# Patient Record
Sex: Male | Born: 1945 | Race: White | Hispanic: No | Marital: Married | Smoking: Former smoker
Health system: Southern US, Community
[De-identification: ages and names within clinical notes are randomized; demographics above are authoritative.]

## PROBLEM LIST (undated history)

## (undated) DIAGNOSIS — Z9189 Other specified personal risk factors, not elsewhere classified: Secondary | ICD-10-CM

## (undated) DIAGNOSIS — Z789 Other specified health status: Secondary | ICD-10-CM

## (undated) DIAGNOSIS — I10 Essential (primary) hypertension: Secondary | ICD-10-CM

## (undated) DIAGNOSIS — K219 Gastro-esophageal reflux disease without esophagitis: Secondary | ICD-10-CM

## (undated) DIAGNOSIS — R3915 Urgency of urination: Secondary | ICD-10-CM

## (undated) DIAGNOSIS — Z8739 Personal history of other diseases of the musculoskeletal system and connective tissue: Secondary | ICD-10-CM

## (undated) DIAGNOSIS — G4733 Obstructive sleep apnea (adult) (pediatric): Secondary | ICD-10-CM

## (undated) DIAGNOSIS — Z8551 Personal history of malignant neoplasm of bladder: Secondary | ICD-10-CM

## (undated) DIAGNOSIS — R351 Nocturia: Secondary | ICD-10-CM

## (undated) DIAGNOSIS — E785 Hyperlipidemia, unspecified: Secondary | ICD-10-CM

## (undated) DIAGNOSIS — K649 Unspecified hemorrhoids: Secondary | ICD-10-CM

## (undated) DIAGNOSIS — R413 Other amnesia: Secondary | ICD-10-CM

## (undated) HISTORY — PX: CATARACT EXTRACTION W/ INTRAOCULAR LENS  IMPLANT, BILATERAL: SHX1307

## (undated) HISTORY — DX: Other amnesia: R41.3

---

## 1965-04-15 HISTORY — PX: OTHER SURGICAL HISTORY: SHX169

## 2001-06-09 ENCOUNTER — Ambulatory Visit (HOSPITAL_COMMUNITY): Admission: RE | Admit: 2001-06-09 | Discharge: 2001-06-09 | Payer: Self-pay | Admitting: Gastroenterology

## 2007-09-15 ENCOUNTER — Ambulatory Visit (HOSPITAL_BASED_OUTPATIENT_CLINIC_OR_DEPARTMENT_OTHER): Admission: RE | Admit: 2007-09-15 | Discharge: 2007-09-16 | Payer: Self-pay | Admitting: Orthopedic Surgery

## 2007-09-15 HISTORY — PX: SHOULDER ARTHROSCOPY DISTAL CLAVICLE EXCISION AND OPEN ROTATOR CUFF REPAIR: SHX2396

## 2010-07-06 ENCOUNTER — Other Ambulatory Visit: Payer: Self-pay | Admitting: Urology

## 2010-07-06 ENCOUNTER — Inpatient Hospital Stay (HOSPITAL_COMMUNITY)
Admission: AD | Admit: 2010-07-06 | Discharge: 2010-07-07 | DRG: 311 | Disposition: A | Discharge status: Home / Self Care | Payer: BC Managed Care – PPO | Source: Ambulatory Visit | Attending: Urology | Admitting: Urology

## 2010-07-06 ENCOUNTER — Ambulatory Visit (HOSPITAL_BASED_OUTPATIENT_CLINIC_OR_DEPARTMENT_OTHER)
Admission: RE | Admit: 2010-07-06 | Discharge: 2010-07-06 | Disposition: A | Discharge status: Still In Hospital | Payer: BC Managed Care – PPO | Source: Ambulatory Visit | Attending: Urology | Admitting: Urology

## 2010-07-06 DIAGNOSIS — I10 Essential (primary) hypertension: Secondary | ICD-10-CM | POA: Insufficient documentation

## 2010-07-06 DIAGNOSIS — Z7982 Long term (current) use of aspirin: Secondary | ICD-10-CM

## 2010-07-06 DIAGNOSIS — Z01810 Encounter for preprocedural cardiovascular examination: Secondary | ICD-10-CM | POA: Insufficient documentation

## 2010-07-06 DIAGNOSIS — E669 Obesity, unspecified: Secondary | ICD-10-CM | POA: Insufficient documentation

## 2010-07-06 DIAGNOSIS — C670 Malignant neoplasm of trigone of bladder: Secondary | ICD-10-CM | POA: Insufficient documentation

## 2010-07-06 DIAGNOSIS — Z01812 Encounter for preprocedural laboratory examination: Secondary | ICD-10-CM | POA: Insufficient documentation

## 2010-07-06 DIAGNOSIS — R310 Gross hematuria: Secondary | ICD-10-CM | POA: Diagnosis present

## 2010-07-06 DIAGNOSIS — Z791 Long term (current) use of non-steroidal anti-inflammatories (NSAID): Secondary | ICD-10-CM

## 2010-07-06 DIAGNOSIS — Z79899 Other long term (current) drug therapy: Secondary | ICD-10-CM

## 2010-07-06 DIAGNOSIS — G4733 Obstructive sleep apnea (adult) (pediatric): Secondary | ICD-10-CM | POA: Insufficient documentation

## 2010-07-06 DIAGNOSIS — K219 Gastro-esophageal reflux disease without esophagitis: Secondary | ICD-10-CM | POA: Diagnosis present

## 2010-07-06 DIAGNOSIS — M109 Gout, unspecified: Secondary | ICD-10-CM | POA: Diagnosis present

## 2010-07-06 DIAGNOSIS — E7800 Pure hypercholesterolemia, unspecified: Secondary | ICD-10-CM | POA: Diagnosis present

## 2010-07-06 HISTORY — PX: TRANSURETHRAL RESECTION OF BLADDER TUMOR: SHX2575

## 2010-07-06 LAB — POCT I-STAT 4, (NA,K, GLUC, HGB,HCT)
Glucose, Bld: 96 mg/dL (ref 70–99)
HCT: 39 % (ref 39.0–52.0)

## 2010-07-10 ENCOUNTER — Observation Stay (HOSPITAL_COMMUNITY)
Admission: EM | Admit: 2010-07-10 | Discharge: 2010-07-12 | Disposition: A | Discharge status: Home / Self Care | Payer: BC Managed Care – PPO | Attending: Urology | Admitting: Urology

## 2010-07-10 DIAGNOSIS — I10 Essential (primary) hypertension: Secondary | ICD-10-CM | POA: Insufficient documentation

## 2010-07-10 DIAGNOSIS — Z79899 Other long term (current) drug therapy: Secondary | ICD-10-CM | POA: Insufficient documentation

## 2010-07-10 DIAGNOSIS — Z01812 Encounter for preprocedural laboratory examination: Secondary | ICD-10-CM | POA: Insufficient documentation

## 2010-07-10 DIAGNOSIS — Z01810 Encounter for preprocedural cardiovascular examination: Secondary | ICD-10-CM | POA: Insufficient documentation

## 2010-07-10 DIAGNOSIS — N3289 Other specified disorders of bladder: Principal | ICD-10-CM | POA: Insufficient documentation

## 2010-07-10 LAB — URINALYSIS, ROUTINE W REFLEX MICROSCOPIC: Specific Gravity, Urine: 1.026 (ref 1.005–1.030)

## 2010-07-10 LAB — BASIC METABOLIC PANEL
CO2: 25 mEq/L (ref 19–32)
Calcium: 9.5 mg/dL (ref 8.4–10.5)
Chloride: 99 mEq/L (ref 96–112)
GFR calc Af Amer: >60 mL/min (ref >60)
Sodium: 133 mEq/L — ABNORMAL LOW (ref 135–145)

## 2010-07-10 LAB — DIFFERENTIAL
Basophils Absolute: 0 10*3/uL (ref 0.0–0.1)
Basophils Relative: 0 % (ref 0–1)
Neutro Abs: 4.4 10*3/uL (ref 1.7–7.7)
Neutrophils Relative %: 77 % (ref 43–77)

## 2010-07-10 LAB — CBC
Hemoglobin: 10.8 g/dL — ABNORMAL LOW (ref 13.0–17.0)
MCHC: 36 g/dL (ref 30.0–36.0)
Platelets: 167 10*3/uL (ref 150–400)
RBC: 3.43 MIL/uL — ABNORMAL LOW (ref 4.22–5.81)

## 2010-07-10 LAB — URINE MICROSCOPIC-ADD ON

## 2010-07-11 ENCOUNTER — Observation Stay (HOSPITAL_COMMUNITY): Payer: BC Managed Care – PPO

## 2010-07-11 HISTORY — PX: OTHER SURGICAL HISTORY: SHX169

## 2010-07-11 LAB — URINE CULTURE: Culture  Setup Time: 201203270851

## 2010-07-31 NOTE — Op Note (Signed)
  NAME:  Donald [name], Donald [name] NO.:  [id number]  MEDICAL RECORD NO.:  [id number]           PATIENT TYPE:  I  LOCATION:  1405                         FACILITY:  [facility]  PHYSICIAN:  Bertram Millard. Alandra Sando, M.D.DATE OF BIRTH:  June 04, 1945  DATE OF PROCEDURE:  07/06/2010 DATE OF DISCHARGE:  07/07/2010                              OPERATIVE REPORT   PRIMARY DIAGNOSIS:  A 2-cm bladder tumor.  POSTOPERATIVE DIAGNOSIS:  A 2-cm bladder tumor.  PRINCIPAL PROCEDURE:  Transurethral resection of the bladder tumour.  SURGEON:  Bertram Millard. Leobardo Granlund, MD  ANESTHESIA:  General with LMA.  COMPLICATIONS:  None.  SPECIMEN:  Bladder tumor, sent in to pathology.  BRIEF HISTORY:  A 65 year old male who presents for definitive management of his recently diagnosed left trigonal bladder tumor.  He was initially presented with gross hematuria.  Staging evaluation was negative.  Risks and complications of TURBT have been discussed with the patient.  He understands these and desires to proceed.  DESCRIPTION OF PROCEDURE:  The patient was identified in the holding area.  He received preoperative IV antibiotics, was taken to the operating room where general anesthetic was administered using LMA.  He was placed in the dorsal lithotomy position.  Genitalia and perineum prepped and draped.  Time-out was then called.  The procedure was then commenced.  A 28-French resectoscope sheath was placed transurethrally into his bladder.  Cursory exam of the bladder revealed a 2-3 cm papillary tumor at the left trigonal area.  I identified the right ureteral orifice, the left could not be seen as his bladder tumor obscured it.  I then placed a cutting loop, and resected the bladder tumor down to muscular layer.  The base was fairly wide and there was not just a small stalk.  It was obvious that we have gotten in the muscular layers with the resection, but I did not see any evidence of perforation.  I never  saw the ureteral orifice during this resection. The specimen was then irrigated from bladder and sent as bladder tumor. I then rechecked the base of the bladder tumor resection, no bleeding was seen.  At this point, a 20 [place] Foley catheter was placed and hooked and balloon was filled with 10 mL of water.  A plug was placed within the catheter.  The patient was awakened and taken to PACU in stable condition.  He tolerated procedure well.  In the PACU, I instilled 40 mg of mitomycin and 40 mL of diluent.  The plug was placed back on the catheter after instillation of this mitomycin.  It was left indwelling for 1 hour.     Bertram Millard. Retta Diones, M.D.     SMD/MEDQ  D:  07/11/2010  T:  07/11/2010  Job:  [id number]  Electronically Signed by Marcine Matar M.D. on 07/31/2010 01:17:13 PM

## 2010-07-31 NOTE — Op Note (Signed)
  NAME:  THURMAN, SARVER NO.:  [id number]  MEDICAL RECORD NO.:  [id number]           PATIENT TYPE:  O  LOCATION:  1417                         FACILITY:  [facility]  PHYSICIAN:  Bertram Millard. Denya Buckingham, M.D.DATE OF BIRTH:  [birth year]  DATE OF PROCEDURE:  07/11/2010 DATE OF DISCHARGE:                              OPERATIVE REPORT   PRINCIPAL PROCEDURES:  Cystoscopy, clot evacuation, fulguration of bleeders.  SURGEON:  Bertram Millard. Malkie Wille, M.D.  ANESTHESIA:  General with LMA.  COMPLICATIONS:  None.  BRIEF HISTORY:  This is a 65 year old male who is about 5 days out from a TURBT.  He had bleeding postoperatively in the PACU.  Catheter was placed and he was irrigated overnight.  He went home the following day. The patient presented yesterday to the emergency room with clot retention and a Foley catheter was placed.  A fair amount of clot was irrigated from his bladder.  The catheter is indwelling and at this point, he still has some bleeding.  He has significant bladder spasms as well.  As he has persistent bleeding, it was recommended that he undergo cystoscopy, clot evacuation and fulguration of bleeders.  Risks and complications have been discussed with the patient who desires to proceed.  DESCRIPTION OF PROCEDURE:  The patient was identified in holding area and had received his p.o. Cipro.  He was taken to the operating room where general anesthetic was administered.  He was placed in dorsal lithotomy position.  Genitalia and perineum were prepped and draped. Time-out was then performed.  A 28-French resectoscope sheath was then placed within his bladder. Approximately 50 cc of clot was evacuated.  The bladder then irrigated clear.  I then placed the resectoscope element with the cutting loop. The resected area could be seen.  It was a bit edematous.  There was one small vessel like area that was protruding through the middle of this area.  I did not think  that this was the ureter, which was not identified due to presence of the bladder tumor.  I then cauterized this area, as there was a fair amount of blood coming from it intermittently. Following cautery with the cutting loop, I observed the resected area for approximately 10 minutes.  No significant bleeding was seen at this point.  The scope was then removed and I placed a 20-French Foley catheter with 10 cc of water placed in the balloon.  It was hooked to dependent drainage.  The urine at this point was clear.  He was taken to PACU in stable condition after he was awakened.     Bertram Millard. Retta Diones, M.D.     SMD/MEDQ  D:  07/11/2010  T:  07/11/2010  Job:  [id number]  Electronically Signed by Marcine Matar M.D. on 07/31/2010 01:17:16 PM

## 2010-08-28 NOTE — Op Note (Signed)
NAME:  Donald [name], Donald [name]               ACCOUNT NO.:  [id number]   MEDICAL RECORD NO.:  [id number]          PATIENT TYPE:  AMB   LOCATION:  DSC                          FACILITY:  [facility]   PHYSICIAN:  Katy Fitch. Sypher, M.D. 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   POSTOPERATIVE DIAGNOSES:  1. Adhesive capsulitis, left shoulder.  2. Chronic degenerative labral tear, left shoulder.  3. A 99-95% deep surface partial articular surface tendon avulsion,      rotator cuff tear involving supraspinatus, infraspinatus, and      superior teres minor, and chronic subacromial bursitis.   OPERATION:  1. Examination of left shoulder under anesthesia documenting adhesive      capsulitis of glenohumeral joint with subsequent lysis of adhesions      and improvement of range of motion.  2. Diagnostic arthroscopy of left glenohumeral joint with      identification of degenerative labral pathology and adhesive      capsulitis, followed by extensive labral debridement, debridement      of adhesive capsulitis granulation tissue with restoration of      rotator interval, tenolysis of anterior capsule ligamentous      structures including the superior and middle glenohumeral ligaments      and arthroscopic debridement of deep surface complex retracted 99-      95% positive articular surface tear involving the supraspinatus,      infraspinatus, and teres minor  tendons.  3. Arthroscopic subacromial decompression with bursectomy,      coracoacromial ligament relaxation, and acromioplasty.  4. Arthroscopic resection of distal clavicle.  5. Open reconstruction of 2.5 tendon rotator cuff retracted tear,      measuring more than 6 cm from anterior to posterior, utilizing 3      medial footprint Bio-Corkscrew anchors, 2 swivel locks for over-the-      top technique, and a McLaughlin through one suture to secure the      supraspinatus anatomically.   OPERATING SURGEON:  Katy Fitch. Sypher, MD   ASSISTANT:  Marveen Reeks. Dasnoit, PA-C.   ANESTHESIA:  General by endotracheal technique without a supplemental  interscalene block.   SUPERVISING ANESTHESIOLOGIST:  Quita Skye. Krista Blue, MD   INDICATIONS:  Donald [name] is a 65 year old gentleman, referred through  the courtesy of Dr. Belva Bertin for evaluation and management of chronic  left shoulder pain predicament.   Donald [name] reported a several year history of increasing shoulder pain,  weakness of abduction and external rotation, and night discomfort.   Initial clinical examination in October 2008 suggested rotator cuff tear  and plain films documented AC arthropathy.  An MRI of left shoulder obtained in October 2008 documented severe  tendinopathy of the supraspinatus and infraspinatus tendons with a near  full-thickness articular surface tear with retraction.  Donald [name] was also  noted have a very unfavorable AC anatomy with a large medial acromial  osteophyte and a large anterior distal clavicle osteophyte causing  impingement with extensive labral degenerative changes and proximal  biceps tendinopathy.   Donald [name] was offered subacromial decompression, arthroscopic debridement, and  repair in the fall of 2008 and Donald [name] elected to postpone surgery until this  time.   Donald [name] returned in April 2009, reporting increasing pain and increasing  weakness.  She requested to proceed with the surgery at this time.   After  detailed informed consent, Donald [name] is brought to the operating room  anticipating debridement, decompression, and repair of the rotator cuff.   PROCEDURE:  Donald [name] was brought to the operating room and  placed in supine position upon the operating table.   Dr. Krista Blue provided a preoperative anesthesia consult and offered an  interscalene block.   Donald [name] stated Donald [name] was not concerned about postoperative pain and  elected to forego the risks of an interscalene block.   Donald [name] was subsequently brought to room #6 and placed in supine position  upon the operating table and under Dr. Robina Ade direct supervision,  general endotracheal anesthesia was induced.   Donald [name] was carefully positioned in the beach-chair position with aid of a  torso and head holder designed for arthroscopy.  Preoperative  examination of range of motion revealed stability of the shoulder to  anterior, inferior, and posterior stress followed by documentation of  combined elevation of a 155 degrees, external rotation of 70, internal  rotation of approximately 60, and extension lacking 15 degrees off the  scapular plane.  With gentle manipulation, adhesions were lysed and  combined elevation increased to 175 degrees, external rotation 290  degrees at 90 degrees abduction, internal rotation to 80 degrees, and  extension to neutral.   The left arm was then prepped with DuraPrep and draped with impervious  arthroscopy drapes.  The shoulder was distended with 20 mL of sterile  saline.  The scope was placed in standard posterior viewing portal.  Diagnostic arthroscopy immediately confirmed a 95-99% PASTA lesion  involving the entire supraspinatus, supraspinatus, and portion of the  teres minor.  An anterior portal was created followed by complete  debridement of the deep surface of the tear.  The supraspinatus and  infraspinatus were retracted medially under deep surface about 3-4 cm.  This was a complex tear.  We  initially created the lateral arthroscopic  portal in an effort to debride the tuberosity.  However, given the  overlying bursa was still intact, we elected ultimately to forego  arthroscopic repair due to the wide footprint of the retracted deep  surface tear, ultimately deciding an open repair was appropriate.  The  anterior capsule was debrided of all granulation tissue.  The anterior  middle and anterior and superior glenohumeral ligaments were debrided of  all granulation tissue and hemolyzed.  The rotator interval was  reestablished.  The degenerative labrum was debrided from 3 o'clock  anteriorly to 10 o'clock posteriorly.  Hemostasis was achieved with  bipolar cautery.   After documentation of the tear predicament, we proceeded to subacromial  decompression and distal clavicle resection.  After bursectomy, the  anatomy of the coracoacromial arch was studied.  There was a large  anterior medial osteophyte and prominent AC  joint.  The capsule AC joint  was taken down the cutting cautery, revealing a very arthritic distal  clavicle.  The acromion was leveled to a type 1 morphology.  The  coracoacromial ligament was released with a cutting cautery and  hemostasis was achieved in the acromial artery branch.  The acromion was  carefully tailored medially to a flat surface and the distal 15-mm  clavicle was removed arthroscopically with particular attention to the  anterior osteophyte.  After hemostasis was achieved, we proceeded  directly to open repair of the rotator cuff.   A 7-cm longitudinal incision was fashioned across the middle of the  acromion laterally.  The middle third of deltoid was split followed by  placement of self-retaining retractors.  After bursectomy, the PASTA  lesion was taken down and the entire greater tuberosity was decorticated  with a bur and lowered using a high-speed Hall drill, lowering the  profile and removing the reactive osteophyte that had grown.   The entire  greater tuberosity deep to the footprint of the infraspinatus,  supraspinatus, and superior teres minor was decorticated and 3 medial  Bio-Corkscrew, 5.5 mm anchors were placed; 1 for the supraspinatus, 1  for the infraspinatus, and 1 at the junction of the infraspinatus and  teres minor.  A McLaughlin suture was placed to grasp the posterior  supraspinatus and was placed through drill holes through bone to  lateralize tendon, particularly on its deep surface.   A total of 6 mattress sutures were used to inset the medial margin of  the tear, followed by tensioning of the McLaughlin suture and using a  pair of over-the-top V sutures to inset the margin to 2 lateral swivel  locks.   A very satisfactory anatomic reconstruction was achieved.   The subacromial space was subsequently irrigated with a power irrigation  with the arthroscopic pump followed by repair the deltoid split with a  corner suture of #2 FiberWire and multiple interrupted sutures of 0  Vicryl repairing the deltoid split.  There were no apparent  complications.   Donald [name] skin was repaired with subdermal sutures of 2-0 Vicryl and  intradermal 3-0 Prolene.  A Steri-Strip was applied followed by  inflation of the wound with 0.25% Marcaine for postoperative analgesia.   Donald [name] will be discharged to the Postanesthesia Care Unit for observation of  vital signs and will be admitted to [facility] for appropriate  pain medication postoperatively.  We anticipate use of p.o. and IV  Dilaudid as well as possible IV PCA morphine.      Katy Fitch Sypher, M.D.  Electronically Signed     RVS/MEDQ  D:  09/15/2007  T:  09/16/2007  Job:  [id number]   cc:   Nadine Counts Dr. Modena Jansky

## 2010-08-31 ENCOUNTER — Ambulatory Visit (HOSPITAL_BASED_OUTPATIENT_CLINIC_OR_DEPARTMENT_OTHER)
Admission: RE | Admit: 2010-08-31 | Discharge: 2010-08-31 | Disposition: A | Discharge status: Home / Self Care | Payer: BC Managed Care – PPO | Source: Ambulatory Visit | Attending: Urology | Admitting: Urology

## 2010-08-31 ENCOUNTER — Other Ambulatory Visit: Payer: Self-pay | Admitting: Urology

## 2010-08-31 DIAGNOSIS — C679 Malignant neoplasm of bladder, unspecified: Secondary | ICD-10-CM | POA: Insufficient documentation

## 2010-08-31 DIAGNOSIS — I10 Essential (primary) hypertension: Secondary | ICD-10-CM | POA: Insufficient documentation

## 2010-08-31 DIAGNOSIS — E7800 Pure hypercholesterolemia, unspecified: Secondary | ICD-10-CM | POA: Insufficient documentation

## 2010-08-31 DIAGNOSIS — Z79899 Other long term (current) drug therapy: Secondary | ICD-10-CM | POA: Insufficient documentation

## 2010-08-31 HISTORY — PX: OTHER SURGICAL HISTORY: SHX169

## 2010-08-31 LAB — POCT I-STAT 4, (NA,K, GLUC, HGB,HCT)
HCT: 38 % — ABNORMAL LOW (ref 39.0–52.0)
Hemoglobin: 12.9 g/dL — ABNORMAL LOW (ref 13.0–17.0)

## 2010-08-31 NOTE — Procedures (Signed)
[name]  Patient:    Donald [name], Donald [name] Visit Number: [id number] MRN: [id number]          Service Type: END Location: ENDO Attending Physician:  Orland Mustard Dictated by:   Llana Aliment. Randa Evens, M.D. Proc. Date: 06/09/01 Admit Date:  06/09/2001   CC:         Donald [name], M.D.   Procedure Report  DATE OF BIRTH:  06-13-1945  PROCEDURE:  Colonoscopy.  MEDICATIONS:  Fentanyl 70 mcg, Versed 4 mg IV.  INDICATIONS:  A patient with heme positive stools on physical examination.  DESCRIPTION OF PROCEDURE:  With the procedure being explained to patient, consent obtained.  The patient was in the left lateral decubitus position. Digital examination was performed.  The Olympus scope was inserted and advanced under direct visualization.  The prep was excellent.  We were able to reach the cecum without difficulty.  The ileocecal valve and appendiceal orifice were seen.  The scope was withdrawn.  The cecum, ascending colon, hepatic flexure, transverse colon, splenic flexure, descending and sigmoid colons were seen well.  No polyps or other lesions were seen.  There was no significant diverticular disease.  Some small internal hemorrhoids were seen upon removal of the scope in the anal canal.  The scope was withdrawn.  The patient tolerated the procedure well and was maintained on low-flow oxygen and pulse oximeter through out the procedure.  ASSESSMENT:  Heme positive stool with only significant finding on colonoscopy being internal hemorrhoids.  PLAN:  Will see back in the office in 3 months to recheck stool. Dictated by:   Llana Aliment. Randa Evens, M.D. Attending Physician:  Orland Mustard DD:  06/09/01 TD:  06/09/01 Job: [id number]

## 2010-09-12 NOTE — Discharge Summary (Signed)
  NAME:  Donald [name], Donald [name] NO.:  [id number]  MEDICAL RECORD NO.:  [id number]           PATIENT TYPE:  I  LOCATION:  1405                         FACILITY:  [facility]  PHYSICIAN:  Bertram Millard. Vinetta Brach, M.D.DATE OF BIRTH:  04/03/1946  DATE OF ADMISSION:  07/06/2010 DATE OF DISCHARGE:  07/07/2010                              DISCHARGE SUMMARY   PRINCIPAL PROCEDURE:  TURBT  PRIMARY DIAGNOSES: 1. Urothelial carcinoma of the bladder. 2. Gross hematuria. 3. Hypertension. 4. History of gastroesophageal reflux disease. 5. History of hypercholesterolemia.  PROCEDURE:  On July 06, 2010, TURBT  BRIEF HISTORY:  [name] is a 65 year old male with recently diagnosed left-sided bladder tumor.  He is admitted for TURBT as his primary therapy.  HOSPITAL COURSE:  The patient was directly admitted to the operating room.  He underwent TURBT of the bladder tumor.  He tolerated the procedure well.  Postoperatively, he had significant hematuria.  He was admitted and spent the night in the hospital with his bladder catheterized and irrigated.  He was discharged home on postoperative day #1.  At that time, his urine was fairly clear.  He voided twice with the second void being clear and tea colored.  He was in improved condition at that time.  DISCHARGE MEDICATIONS:  Included triamcinolone cream p.r.n., he was told to hold his aspirin for about a week as well as his Aleve.  He was discharged on his multivitamins, Zetia 10 mg q.a.m., allopurinol 300 mg q.a.m., labetalol 200 mg p.o. q.a.m., hydrochlorothiazide 25 mg q.a.m., pravastatin 40 mg p.o. q.a.m., and clonidine was 0.1 mg p.o. q.a.m.Marland Kitchen  He will follow up in approximately 1 week.     Bertram Millard. Retta Diones, M.D.     SMD/MEDQ  D:  08/20/2010  T:  08/20/2010  Job:  [id number]  Electronically Signed by Marcine Matar M.D. on 09/12/2010 12:47:09 PM
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
## 2010-10-10 NOTE — Op Note (Signed)
NAME:  Donald [name], KADRMAS NO.:  [id number]  MEDICAL RECORD NO.:  [id number]           PATIENT TYPE:  LOCATION:                                 FACILITY:  PHYSICIAN:  Bertram Millard. Christabell Loseke, M.D.DATE OF BIRTH:  1945-10-12  DATE OF PROCEDURE:  08/31/2010 DATE OF DISCHARGE:                              OPERATIVE REPORT   PREOPERATIVE DIAGNOSIS:  History of urothelial carcinoma of the bladder, stage TA, high-grade, papillary.  POSTOPERATIVE DIAGNOSIS:  History of urothelial carcinoma of the bladder, stage TA, high-grade, papillary.  PRINCIPAL PROCEDURE:  Cystoscopy, bladder biopsy.  SURGEON:  Bertram Millard. Lorie Melichar, MD  ANESTHESIA:  General with LMA.  COMPLICATIONS:  None.  BRIEF HISTORY:  This 64 year old male presents at this time for repeat TURBT.  He underwent his initial procedure in late March.  He had urothelial carcinoma of the bladder, high-grade, papillary.  Because of the high-grade nature of it, he presents at this time for rebiopsy to rule out muscular invasion or recurrence.  The risks and complications of the procedure have been discussed with the patient.  He understands these and desires to proceed.  DESCRIPTION OF PROCEDURE:  The patient was identified in the holding area and received preoperative IV antibiotics.  He was taken to the operating room, where general anesthetic was administered with LMA.  He was placed in the dorsolithotomy position.  Genitalia and perineum were prepped and draped.  A time-out was then performed.  The procedure then commenced.  A 22-French panendoscope was advanced under direct vision through his urethra.  There was a very minimal narrowing at the bulbous urethra, but no significant stricture.  The prostate was nonobstructed.  His bladder was entered and inspected circumferentially.  A stellate scar was present in the left lateral trigone area.  It was a little bit difficult to see the ureteral orifice in this  area, as that is where the area of resection was.  I thought it was somewhat stenotic.  There was, just inferior to this, a very small area of slightly raised urothelium.  I could not tell if this was a discrete papillary tumor.  This was approximately 1 cm in size.  This was easily removed with the cold cup biopsy forceps.  The biopsy included the old area of resection as well.  There was no significant bleeding, but I used the Bugbee electrode to cauterize the base of the biopsied area.  This was quite hemostatic after which.  The small specimens, approximately 4 or 5 in all, were sent as bladder lesion.  At this point the bladder was reinspected, no bleeding was seen.  The bladder was drained and the scope removed.  The patient tolerated the procedure well.  He was awakened and taken to the PACU in stable condition.  He will follow up in my office on June 13.  He was discharged on Bactrim DS 1 p.o. b.i.d. for 3 days and Uribel.     Bertram Millard. Lashelle Koy, M.D.     SMD/MEDQ  D:  08/31/2010  T:  08/31/2010  Job:  [id number]  cc:   [facility]  Electronically Signed by Marcine Matar M.D. on 10/10/2010 02:46:55 PM

## 2010-10-26 NOTE — Discharge Summary (Signed)
  NAME:  REED, DADY NO.:  [id number]  MEDICAL RECORD NO.:  [id number]  LOCATION:  1417                         FACILITY:  [facility]  PHYSICIAN:  Bertram Millard. Markail Diekman, M.D.DATE OF BIRTH:  07/20/1945  DATE OF ADMISSION:  07/10/2010 DATE OF DISCHARGE:  07/12/2010                              DISCHARGE SUMMARY   PRIMARY DIAGNOSIS:  Gross hematuria.  PRINCIPAL PROCEDURE:  Cysto, clot evacuation, fulguration of bladder bleeder on July 11, 2010.  OTHER DIAGNOSES: 1. Hypertension. 2. Gout. 3. Hypercholesterolemia. 4. High-grade urothelial carcinoma of the bladder.  BRIEF HISTORY:  Mr. Cottingham is a 65 year old male who was diagnosed fairly recently with urothelial carcinoma of the bladder.  He underwent his original resection on July 06, 2010.  He remained in the hospital overnight but presented to the emergency room with recurrent bleeding. He was admitted on July 10, 2010, for the bleeding.LABORATORY DATA:  Hematocrit was 30%, white count 50800, platelet count 110,000.  Basic metabolic profile was normal.  HOSPITAL COURSE:  The patient was admitted on July 10, 2010, for clot retention.  Foley catheter was placed and he was irrigated.  Despite irrigation, the patient continued with bleeding.  He was taken to the operating room on July 11, 2010.  On the left side of the bladder, the resected site, there was a small vessel that was bleeding.  This was cauterized.  He was left with a catheter overnight, which was then discontinued on postoperative day #1 which was July 12, 2010.  That time he was voiding well and his urine had cleared.  He was discharged at that time.  DISCHARGE MEDICATIONS:  Included: 1. Zetia 10 mg in the morning. 2. Triamcinolone topical cream twice daily as needed. 3. Pravastatin 40 mg q.a.m. 4. Multivitamins q.a.m. 5. Labetalol 200 mg q.a.m. 6. HCTZ 25 mg q.a.m. 7. Clonidine 0.1 mg q.a.m. 8. Aspirin 81 mg - that will be started in  about a week. 9. He was also put back on his allopurinol 300 mg q.a.m. and his Aleve     can be started in approximately 1 week.  He was discharged in improved condition, voiding well.  He will follow up in my office.     Bertram Millard. Retta Diones, M.D.     SMD/MEDQ  D:  10/11/2010  T:  10/11/2010  Job:  [id number]  Electronically Signed by Marcine Matar M.D. on 10/26/2010 04:50:10 PM

## 2011-01-10 LAB — POCT HEMOGLOBIN-HEMACUE: Hemoglobin: 13.5

## 2011-01-10 LAB — BASIC METABOLIC PANEL
GFR calc Af Amer: >60
GFR calc non Af Amer: >60
Potassium: 4.4
Sodium: 139

## 2011-06-12 ENCOUNTER — Other Ambulatory Visit: Payer: Self-pay | Admitting: Urology

## 2011-07-09 ENCOUNTER — Encounter (HOSPITAL_BASED_OUTPATIENT_CLINIC_OR_DEPARTMENT_OTHER): Payer: Self-pay | Admitting: *Deleted

## 2011-07-10 ENCOUNTER — Encounter (HOSPITAL_BASED_OUTPATIENT_CLINIC_OR_DEPARTMENT_OTHER): Payer: Self-pay | Admitting: *Deleted

## 2011-07-11 ENCOUNTER — Encounter (HOSPITAL_BASED_OUTPATIENT_CLINIC_OR_DEPARTMENT_OTHER): Payer: Self-pay | Admitting: *Deleted

## 2011-07-11 NOTE — Progress Notes (Signed)
NPO AFTER MN. ARRIVES AT 1015. NEEDS ISTAT. CURRENT EKG W/ CHART. WILL TAKE LABETALOL, CLONIDINE, AND PRAVASTATIN AM OF SURG. W/ SIP OF WATER.

## 2011-07-17 ENCOUNTER — Other Ambulatory Visit: Payer: Self-pay | Admitting: Family Medicine

## 2011-07-17 DIAGNOSIS — Z87891 Personal history of nicotine dependence: Secondary | ICD-10-CM

## 2011-07-19 ENCOUNTER — Encounter (HOSPITAL_BASED_OUTPATIENT_CLINIC_OR_DEPARTMENT_OTHER): Payer: Self-pay | Admitting: Certified Registered"

## 2011-07-19 ENCOUNTER — Encounter (HOSPITAL_BASED_OUTPATIENT_CLINIC_OR_DEPARTMENT_OTHER)
Admission: RE | Disposition: A | Discharge status: Home / Self Care | Payer: Self-pay | Source: Ambulatory Visit | Attending: Urology

## 2011-07-19 ENCOUNTER — Ambulatory Visit (HOSPITAL_BASED_OUTPATIENT_CLINIC_OR_DEPARTMENT_OTHER)
Admission: RE | Admit: 2011-07-19 | Discharge: 2011-07-20 | Disposition: A | Discharge status: Home / Self Care | Payer: Medicare Other | Source: Ambulatory Visit | Attending: Urology | Admitting: Urology

## 2011-07-19 ENCOUNTER — Ambulatory Visit (HOSPITAL_BASED_OUTPATIENT_CLINIC_OR_DEPARTMENT_OTHER): Payer: Medicare Other | Admitting: Certified Registered"

## 2011-07-19 ENCOUNTER — Encounter (HOSPITAL_BASED_OUTPATIENT_CLINIC_OR_DEPARTMENT_OTHER): Payer: Self-pay | Admitting: *Deleted

## 2011-07-19 DIAGNOSIS — K219 Gastro-esophageal reflux disease without esophagitis: Secondary | ICD-10-CM | POA: Insufficient documentation

## 2011-07-19 DIAGNOSIS — D303 Benign neoplasm of bladder: Secondary | ICD-10-CM | POA: Insufficient documentation

## 2011-07-19 DIAGNOSIS — G4733 Obstructive sleep apnea (adult) (pediatric): Secondary | ICD-10-CM | POA: Insufficient documentation

## 2011-07-19 DIAGNOSIS — Z79899 Other long term (current) drug therapy: Secondary | ICD-10-CM | POA: Insufficient documentation

## 2011-07-19 DIAGNOSIS — C679 Malignant neoplasm of bladder, unspecified: Secondary | ICD-10-CM | POA: Insufficient documentation

## 2011-07-19 DIAGNOSIS — E785 Hyperlipidemia, unspecified: Secondary | ICD-10-CM | POA: Insufficient documentation

## 2011-07-19 DIAGNOSIS — I10 Essential (primary) hypertension: Secondary | ICD-10-CM | POA: Insufficient documentation

## 2011-07-19 HISTORY — DX: Gastro-esophageal reflux disease without esophagitis: K21.9

## 2011-07-19 HISTORY — DX: Personal history of other diseases of the musculoskeletal system and connective tissue: Z87.39

## 2011-07-19 HISTORY — DX: Unspecified hemorrhoids: K64.9

## 2011-07-19 HISTORY — DX: Hyperlipidemia, unspecified: E78.5

## 2011-07-19 HISTORY — PX: TRANSURETHRAL RESECTION OF BLADDER TUMOR: SHX2575

## 2011-07-19 HISTORY — DX: Nocturia: R35.1

## 2011-07-19 HISTORY — DX: Obstructive sleep apnea (adult) (pediatric): G47.33

## 2011-07-19 HISTORY — DX: Essential (primary) hypertension: I10

## 2011-07-19 HISTORY — DX: Urgency of urination: R39.15

## 2011-07-19 LAB — POCT I-STAT, CHEM 8
Creatinine, Ser: 1.1 mg/dL (ref 0.50–1.35)
HCT: 42 % (ref 39.0–52.0)
Hemoglobin: 14.3 g/dL (ref 13.0–17.0)
Potassium: 3.9 mEq/L (ref 3.5–5.1)
Sodium: 142 mEq/L (ref 135–145)
TCO2: 26 mmol/L (ref 0–100)

## 2011-07-19 SURGERY — TURBT (TRANSURETHRAL RESECTION OF BLADDER TUMOR)
Anesthesia: General | Site: Bladder | Wound class: Clean Contaminated

## 2011-07-19 MED ORDER — HYDROCHLOROTHIAZIDE 25 MG PO TABS: 25.0000 mg | ORAL_TABLET | Freq: Every morning | ORAL | Status: DC

## 2011-07-19 MED ORDER — SODIUM CHLORIDE 0.9 % IJ SOLN: 3.0000 mL | Freq: Two times a day (BID) | INTRAMUSCULAR | Status: DC

## 2011-07-19 MED ORDER — PROMETHAZINE HCL 25 MG/ML IJ SOLN: 6.2500 mg | INTRAMUSCULAR | Status: DC | PRN

## 2011-07-19 MED ORDER — SODIUM CHLORIDE 0.9 % IV SOLN: 250.0000 mL | INTRAVENOUS | Status: DC | PRN

## 2011-07-19 MED ORDER — SODIUM CHLORIDE 0.9 % IR SOLN
Status: DC | PRN
  Administered 2011-07-19: 3000 mL

## 2011-07-19 MED ORDER — CLONIDINE HCL 0.1 MG PO TABS: 0.1000 mg | ORAL_TABLET | Freq: Every morning | ORAL | Status: DC

## 2011-07-19 MED ORDER — BELLADONNA ALKALOIDS-OPIUM 16.2-60 MG RE SUPP: 1.0000 | Freq: Four times a day (QID) | RECTAL | Status: DC | PRN

## 2011-07-19 MED ORDER — ONDANSETRON HCL 4 MG/2ML IJ SOLN
INTRAMUSCULAR | Status: DC | PRN
  Administered 2011-07-19: 4 mg via INTRAVENOUS

## 2011-07-19 MED ORDER — CIPROFLOXACIN IN D5W 400 MG/200ML IV SOLN
400.0000 mg | INTRAVENOUS | Status: AC
  Administered 2011-07-19: 400 mg via INTRAVENOUS

## 2011-07-19 MED ORDER — HYDROCODONE-ACETAMINOPHEN 5-325 MG PO TABS: 1.0000 | ORAL_TABLET | ORAL | Status: DC | PRN

## 2011-07-19 MED ORDER — OXYCODONE HCL 5 MG PO TABS
5.0000 mg | ORAL_TABLET | ORAL | Status: DC | PRN
Start: 2011-07-19 — End: 2011-07-20

## 2011-07-19 MED ORDER — NAPROXEN SODIUM 220 MG PO TABS: 220.0000 mg | ORAL_TABLET | ORAL | Status: DC | PRN

## 2011-07-19 MED ORDER — CIPROFLOXACIN HCL 250 MG PO TABS: 250.0000 mg | ORAL_TABLET | Freq: Two times a day (BID) | ORAL | Status: AC

## 2011-07-19 MED ORDER — DEXAMETHASONE SODIUM PHOSPHATE 4 MG/ML IJ SOLN
INTRAMUSCULAR | Status: DC | PRN
  Administered 2011-07-19: 10 mg via INTRAVENOUS

## 2011-07-19 MED ORDER — MIDAZOLAM HCL 5 MG/5ML IJ SOLN
INTRAMUSCULAR | Status: DC | PRN
  Administered 2011-07-19: 2 mg via INTRAVENOUS

## 2011-07-19 MED ORDER — LACTATED RINGERS IV SOLN: INTRAVENOUS | Status: DC

## 2011-07-19 MED ORDER — ONDANSETRON HCL 4 MG/2ML IJ SOLN: 4.0000 mg | Freq: Four times a day (QID) | INTRAMUSCULAR | Status: DC | PRN

## 2011-07-19 MED ORDER — EZETIMIBE 10 MG PO TABS: 10.0000 mg | ORAL_TABLET | Freq: Every day | ORAL | Status: DC

## 2011-07-19 MED ORDER — ACETAMINOPHEN 650 MG RE SUPP: 650.0000 mg | RECTAL | Status: DC | PRN

## 2011-07-19 MED ORDER — SODIUM CHLORIDE 0.45 % IV SOLN
INTRAVENOUS | Status: DC
  Administered 2011-07-19: 13:00:00 via INTRAVENOUS

## 2011-07-19 MED ORDER — ACETAMINOPHEN 325 MG PO TABS: 650.0000 mg | ORAL_TABLET | ORAL | Status: DC | PRN

## 2011-07-19 MED ORDER — FENTANYL CITRATE 0.05 MG/ML IJ SOLN: 25.0000 ug | INTRAMUSCULAR | Status: DC | PRN

## 2011-07-19 MED ORDER — LABETALOL HCL 200 MG PO TABS: 200.0000 mg | ORAL_TABLET | Freq: Every morning | ORAL | Status: DC

## 2011-07-19 MED ORDER — FENTANYL CITRATE 0.05 MG/ML IJ SOLN
INTRAMUSCULAR | Status: DC | PRN
  Administered 2011-07-19 (×2): 50 ug via INTRAVENOUS

## 2011-07-19 MED ORDER — LIDOCAINE HCL (CARDIAC) 20 MG/ML IV SOLN
INTRAVENOUS | Status: DC | PRN
  Administered 2011-07-19: 50 mg via INTRAVENOUS

## 2011-07-19 MED ORDER — URIBEL 118 MG PO CAPS: 1.0000 | ORAL_CAPSULE | Freq: Three times a day (TID) | ORAL | Status: DC | PRN

## 2011-07-19 MED ORDER — SODIUM CHLORIDE 0.9 % IJ SOLN: 3.0000 mL | INTRAMUSCULAR | Status: DC | PRN

## 2011-07-19 MED ORDER — PROPOFOL 10 MG/ML IV EMUL
INTRAVENOUS | Status: DC | PRN
  Administered 2011-07-19: 150 mg via INTRAVENOUS

## 2011-07-19 MED ORDER — LABETALOL HCL 5 MG/ML IV SOLN
INTRAVENOUS | Status: DC | PRN
  Administered 2011-07-19 (×2): 5 mg via INTRAVENOUS

## 2011-07-19 MED ORDER — SULFAMETHOXAZOLE-TRIMETHOPRIM 800-160 MG PO TABS: 1.0000 | ORAL_TABLET | Freq: Two times a day (BID) | ORAL | Status: AC

## 2011-07-19 MED ORDER — LACTATED RINGERS IV SOLN
INTRAVENOUS | Status: DC
  Administered 2011-07-19: 11:00:00 via INTRAVENOUS

## 2011-07-19 MED ORDER — ASPIRIN EC 81 MG PO TBEC: 81.0000 mg | DELAYED_RELEASE_TABLET | Freq: Every day | ORAL | Status: DC

## 2011-07-19 MED ORDER — ALLOPURINOL 300 MG PO TABS: 300.0000 mg | ORAL_TABLET | Freq: Every morning | ORAL | Status: DC

## 2011-07-19 MED ORDER — MORPHINE SULFATE 2 MG/ML IJ SOLN
2.0000 mg | INTRAMUSCULAR | Status: DC | PRN
Start: 2011-07-19 — End: 2011-07-20

## 2011-07-19 MED ORDER — STERILE WATER FOR IRRIGATION IR SOLN
Status: DC | PRN
  Administered 2011-07-19: 3000 mL via INTRAVESICAL

## 2011-07-19 SURGICAL SUPPLY — 26 items
BAG DRAIN URO-CYSTO SKYTR STRL (DRAIN) ×2 IMPLANT
BAG DRN ANRFLXCHMBR STRAP LEK (BAG) ×1
BAG URINE DRAINAGE (UROLOGICAL SUPPLIES) IMPLANT
BAG URINE LEG 19OZ MD ST LTX (BAG) ×2 IMPLANT
CANISTER SUCT LVC 12 LTR MEDI- (MISCELLANEOUS) ×2 IMPLANT
CATH FOLEY 2WAY SLVR  5CC 20FR (CATHETERS) ×1
CATH FOLEY 2WAY SLVR  5CC 22FR (CATHETERS)
CATH FOLEY 2WAY SLVR 5CC 20FR (CATHETERS) ×1 IMPLANT
CATH FOLEY 2WAY SLVR 5CC 22FR (CATHETERS) IMPLANT
CLOTH BEACON ORANGE TIMEOUT ST (SAFETY) ×2 IMPLANT
DRAPE CAMERA CLOSED 9X96 (DRAPES) ×2 IMPLANT
ELECT BUTTON BIOP 24F 90D PLAS (MISCELLANEOUS) IMPLANT
ELECT LOOP MED HF 24F 12D CBL (CLIP) ×2 IMPLANT
ELECT REM PT RETURN 9FT ADLT (ELECTROSURGICAL) ×2
ELECTRODE REM PT RTRN 9FT ADLT (ELECTROSURGICAL) ×1 IMPLANT
EVACUATOR MICROVAS BLADDER (UROLOGICAL SUPPLIES) IMPLANT
GLOVE BIO SURGEON STRL SZ 6.5 (GLOVE) ×2 IMPLANT
GLOVE BIO SURGEON STRL SZ8 (GLOVE) ×2 IMPLANT
GLOVE ECLIPSE 6.0 STRL STRAW (GLOVE) ×2 IMPLANT
GOWN STRL REIN XL XLG (GOWN DISPOSABLE) ×2 IMPLANT
GOWN XL W/COTTON TOWEL STD (GOWNS) ×6 IMPLANT
HOLDER FOLEY CATH W/STRAP (MISCELLANEOUS) IMPLANT
PACK CYSTOSCOPY (CUSTOM PROCEDURE TRAY) ×2 IMPLANT
PLUG CATH AND CAP STER (CATHETERS) IMPLANT
SET ASPIRATION TUBING (TUBING) IMPLANT
SYRINGE IRR TOOMEY STRL 70CC (SYRINGE) ×2 IMPLANT

## 2011-07-19 NOTE — Op Note (Signed)
Preoperative diagnosis: History of urothelial carcinoma the bladder with recurrent tumors  Postoperative diagnosis: Same  Procedure: Cystoscopy, TURBT 2 cm bladder tumor  Surgeon: Lukis Bunt  Anesthesia: Gen. with LMA  Specimens: 1. Posterior midline bladder biopsy  2. Left trigonal bladder biopsy  3. Right bladder tumor  Drains: 20 French Foley catheter  Complications: None  Indications: Middle-aged male with recurrent urothelial carcinoma the bladder. He presents at this time for cystoscopy and bladder biopsy/TURBT. He is aware of risks and complications and desires to proceed  Description of procedure: The patient was identified in the holding area and received preoperative IV Cipro. He was taken to the operating room where general anesthetic was administered with the LMA. He was placed in the dorsolithotomy position. Genitalia and perineum were prepped and draped. Time out was performed.  A 22 French panendoscope was advanced to the urethra. There was very mild bulbous urethral stricture which was easily traversed with the beak of the scope. The prostate was somewhat obstructive with an enlarged median lobe. Bladder was inspected circumferentially. Both ureteral orifices were normal in location. The left ureteral orifice was somewhat wide mouth. There was an erythematous area in the midline trigone which was biopsied and sent as "posterior midline biopsy". The left sided trigonal area was consistent with prior resection, with perhaps an 8mm low papillary lesion which was biopsied. This was excised totally. The prior TUR site was also biopsied and sent with the same left sided trigonal biopsy. Inspection of the right side of the bladder revealed a 2 cm low papillary lesion just inferior to the right ureteral orifice. This was resected with the cutting loop. Resection was taken down into the muscular layer. Small fragments were set as "right-sided bladder tumor". All biopsy sites were cauterized  with hemostasis achieved. There was a 2 mm papillary lesion in the right anterior bladder which was cauterized. Following inspection of the bladder after cauterization and no bleeding seen, the scope was removed and a 20 French Foley catheter with 5 cc balloon placed. The patient was awakened and taken to the PACU in stable condition.
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
## 2011-07-19 NOTE — Anesthesia Postprocedure Evaluation (Signed)
Anesthesia Post Note  Patient: Donald [name]  Procedure(s) Performed: Procedure(s) (LRB): TRANSURETHRAL RESECTION OF BLADDER TUMOR (TURBT) (N/A)  Anesthesia type: General  Patient location: PACU  Post pain: Pain level controlled  Post assessment: Post-op Vital signs reviewed  Last Vitals:  Filed Vitals:   07/19/11 1306  BP: 141/89  Pulse: 67  Temp: 36.1 C  Resp: 18    Post vital signs: Reviewed  Level of consciousness: sedated  Complications: No apparent anesthesia complications

## 2011-07-19 NOTE — Transfer of Care (Signed)
Immediate Anesthesia Transfer of Care Note  Patient: Donald [name]  Procedure(s) Performed: Procedure(s) (LRB): TRANSURETHRAL RESECTION OF BLADDER TUMOR (TURBT) (N/A)  Patient Location: PACU  Anesthesia Type: General  Level of Consciousness: drowsy, follows commands, talking and comfortable  Airway & Oxygen Therapy: Patient Spontanous Breathing and Patient connected to face mask oxygen  Post-op Assessment: Report given to PACU RN and Post -op Vital signs reviewed and stable  Post vital signs: Reviewed and stable  Complications: No apparent anesthesia complications

## 2011-07-19 NOTE — Anesthesia Preprocedure Evaluation (Addendum)
Anesthesia Evaluation  Patient identified by MRN, date of birth, ID band Patient awake    Reviewed: Allergy & Precautions, H&P , NPO status , Patient's Chart, lab work & pertinent test results  Airway Mallampati: II TM Distance: >3 FB Neck ROM: Full    Dental  (+) Teeth Intact, Poor Dentition and Dental Advisory Given   Pulmonary neg pulmonary ROS, sleep apnea ,  breath sounds clear to auscultation  Pulmonary exam normal       Cardiovascular hypertension, negative cardio ROS  Rhythm:Regular Rate:Normal     Neuro/Psych negative neurological ROS  negative psych ROS   GI/Hepatic negative GI ROS, Neg liver ROS,   Endo/Other  Morbid obesity  Renal/GU negative Renal ROS  negative genitourinary   Musculoskeletal negative musculoskeletal ROS (+)   Abdominal (+) + obese,   Peds  Hematology negative hematology ROS (+)   Anesthesia Other Findings   Reproductive/Obstetrics negative OB ROS                           Anesthesia Physical Anesthesia Plan  ASA: II  Anesthesia Plan: General   Post-op Pain Management:    Induction: Intravenous  Airway Management Planned: LMA  Additional Equipment:   Intra-op Plan:   Post-operative Plan:   Informed Consent: I have reviewed the patients History and Physical, chart, labs and discussed the procedure including the risks, benefits and alternatives for the proposed anesthesia with the patient or authorized representative who has indicated his/her understanding and acceptance.   Dental advisory given  Plan Discussed with: CRNA  Anesthesia Plan Comments:         Anesthesia Quick Evaluation

## 2011-07-20 NOTE — Progress Notes (Signed)
  Subjective: Patient reports : no pain or discomfort.    Objective: Vital signs in last 24 hours: Temp:  [97 F (36.1 C)-98.7 F (37.1 C)] 97.1 F (36.2 C) (04/06 0646) Pulse Rate:  [64-90] 64  (04/06 0646) Resp:  [16-20] 18  (04/06 0646) BP: (130-161)/(74-91) 149/82 mmHg (04/06 0646) SpO2:  [94 %-99 %] 94 % (04/06 0646)  Intake/Output from previous day: 04/05 0701 - 04/06 0700 In: 4010 [P.O.:2160; I.V.:1850] Out: 2830 [Urine:2830] Intake/Output this shift: Total I/O In: 1430 [P.O.:480; I.V.:950] Out: 2800 [Urine:2800]  Foley out.  Has voided well. Urine is clear. Physical Exam:  Lungs - Normal respiratory effort, chest expands symmetrically.  Abdomen - Soft, non-tender & non-distended  Lab Results:  Basename 07/19/11 1105  HGB 14.3  HCT 42.0   BMET  Basename 07/19/11 1105  NA 142  K 3.9  CL 106  CO2 --  GLUCOSE 111*  BUN 23  CREATININE 1.10  CALCIUM --     Assessment/Plan:  Doing well.  Home today.  Follow-up with Dr Retta Diones   LOS: 1 day   Emer Onnen-HENRY 07/20/2011, 6:59 AM

## 2011-07-20 NOTE — Discharge Instructions (Signed)
Transurethral Resection, Bladder Tumor A cancerous growth (tumor) can develop on the inside wall of the bladder. The bladder is the organ that holds urine. One way to remove the tumor is a procedure called a transurethral resection. The tumor is removed (resected) through the tube that carries urine from the bladder out of the body (urethra). No cuts (incisions) are made in the skin. Instead, the procedure is done through a thin telescope, called a resectoscope. Attached to it is a light and usually a tiny camera. The resectoscope is put into the urethra. In men, the urethra opens at the end of the penis. In women, it opens just above the vagina.  A transurethral resection is usually used to remove tumors that have not gotten too big or too deep. These are called Stage 0, Stage 1 or Stage 2 bladder cancers. LET YOUR CAREGIVER KNOW ABOUT:  On the day of the procedure, your caregivers will need to know the last time you had anything to eat or drink. This includes water, gum, and candy. In advance, make sure they know about:   Any allergies.   All medications you are taking, including:   Herbs, eyedrops, over-the-counter medications and creams.   Blood thinners (anticoagulants), aspirin or other drugs that could affect blood clotting.   Use of steroids (by mouth or as creams).   Previous problems with anesthetics, including local anesthetics.   Possibility of pregnancy, if this applies.   Any history of blood clots.   Any history of bleeding or other blood problems.   Previous surgery.   Smoking history.   Any recent symptoms of colds or infections.   Other health problems.  RISKS AND COMPLICATIONS This is usually a safe procedure. Every procedure has risks, though. For a transurethral resection, they include:  Infection. Antibiotic medication would need to be taken.   Bleeding.   Light bleeding may last for several days after the procedure.   If bleeding continues or is heavy,  the bladder may need rinsing. Or, a new catheter might be put in for awhile.   Sometimes bed rest is needed.   Urination problems.   Pain and burning can occur when urinating. This usually goes away in a few days.   Scarring from the procedure can block the flow of urine.   Bladder damage.   It can be punctured or torn during removal of the tumor. If this happens, a catheter might be needed for longer. Antibiotics would be taken while the bladder heals.   Urine can leak through the hole or tear into the abdomen. If this happens, surgery may be needed to repair the bladder.  BEFORE THE PROCEDURE   A medical evaluation will be done. This may include:   A physical examination.   Urine test. This is to make sure you do not have a urinary tract infection.   Blood tests.   A test that checks the heart's rhythm (electrocardiogram).   Talking with an anesthesiologist. This is the person who will be in charge of the medication (anesthesia) to keep you from feeling pain during the transurethral resection. You might be asleep during the procedure (general anesthesia) or numb from the waist down, but awake during the procedure (spinal anesthesia). Ask your surgeon what to expect.   The person who is having a transurethral resection needs to give what is called informed consent. This requires signing a legal paper that gives permission for the procedure. To give informed consent:   You must  understand how the procedure is done and why.   You must be told all the risks and benefits of the procedure.   You must sign the consent. Sometimes a legal guardian can do this.   Signing should be witnessed by a healthcare professional.   The day before the surgery, eat only a light dinner. Then, do not eat or drink anything for at least 8 hours before the surgery. Ask your caregiver if it is OK to take any needed medicines with a sip of water.   Arrive at least an hour before the surgery or whenever  your surgeon recommends. This will give you time to check in and fill out any needed paperwork.  PROCEDURE  The preparation:   You will change into a hospital gown.   A needle will be inserted in your arm. This is an intravenous access tube (IV). Medication will be able to flow directly into your body through this needle.   Small monitors will be put on your body. They are used to check your heart, blood pressure, and oxygen level.   You might be given medication that will help you relax (sedative).   You will be given a general anesthetic or spinal anesthesia.   The procedure:   Once you are asleep or numb from the waist down, your legs will be placed in stirrups.   The resectoscope will be passed through the urethra into the bladder.   Fluid will be passed through the resectoscope. This will fill the bladder with water.   The surgeon will examine the bladder through the scope. If the scope has a camera, it can take pictures from inside the bladder. They can be projected onto a TV screen.   The surgeon will use various tools to remove the tumor in small pieces. Sometimes a laser (a beam of light energy) is used. Other tools may use electric current.   A tube (catheter) will often be placed so that urine can drain into a bag outside the body. This process helps stop bleeding. This tube keeps blood clots from blocking the urethra.   The procedure usually takes 30 to 45 minutes.  AFTER THE PROCEDURE   You will stay in a recovery area until the anesthesia has worn off. Your blood pressure and pulse will be checked every so often. Then you will be taken to a hospital room.   You may continue to get fluids through the IV for awhile.   Some pain is normal. The catheter might be uncomfortable. Pain is usually not severe. If it is, ask for pain medicine.   Your urine may look bloody after a transurethral resection. This is normal.   If bleeding is heavy, a hospital caregiver may rinse  out the bladder (irrigation) through the catheter.   Once the urine is clear, the catheter will be taken out.   You will need to stay in the hospital until you can urinate on your own.   Most people stay in the hospital for up to 4 days.  PROGNOSIS   Transurethral resection is considered the best way to treat bladder tumors that are not too far along. For most people, the treatment is successful. Sometimes, though, more treatment is needed.   Bladder cancers can come back even after a successful procedure. Because of this, be sure to have a checkup with your caregiver every 3 to 6 months. If everything is OK for 3 years, you can reduce the checkups to once a  year.  Document Released: 01/26/2009 Document Revised: 03/21/2011 Document Reviewed: 01/26/2009 [facility] Patient Information 2012 [place], [place].

## 2011-07-22 ENCOUNTER — Encounter (HOSPITAL_BASED_OUTPATIENT_CLINIC_OR_DEPARTMENT_OTHER): Payer: Self-pay | Admitting: Urology

## 2011-12-23 ENCOUNTER — Other Ambulatory Visit: Payer: Self-pay | Admitting: Neurological Surgery

## 2011-12-23 DIAGNOSIS — M542 Cervicalgia: Secondary | ICD-10-CM

## 2011-12-31 ENCOUNTER — Ambulatory Visit
Admission: RE | Admit: 2011-12-31 | Discharge: 2011-12-31 | Disposition: A | Discharge status: Home / Self Care | Payer: Medicare Other | Source: Ambulatory Visit | Attending: Neurological Surgery | Admitting: Neurological Surgery

## 2011-12-31 DIAGNOSIS — M542 Cervicalgia: Secondary | ICD-10-CM

## 2012-09-25 ENCOUNTER — Other Ambulatory Visit: Payer: Self-pay | Admitting: Urology

## 2012-09-28 ENCOUNTER — Encounter (HOSPITAL_BASED_OUTPATIENT_CLINIC_OR_DEPARTMENT_OTHER): Payer: Self-pay | Admitting: *Deleted

## 2012-09-29 ENCOUNTER — Encounter (HOSPITAL_BASED_OUTPATIENT_CLINIC_OR_DEPARTMENT_OTHER): Payer: Self-pay | Admitting: *Deleted

## 2012-09-29 NOTE — Progress Notes (Signed)
NPO AFTER MN. ARRIVES AT 0745. NEEDS ISTAT AND EKG. WILL TAKE CLONIDINE, PRAVASTATIN, AND LABETOLOL AM OF SURG W/ SIPS OF WATER.

## 2012-10-05 ENCOUNTER — Encounter (HOSPITAL_BASED_OUTPATIENT_CLINIC_OR_DEPARTMENT_OTHER): Payer: Self-pay | Admitting: *Deleted

## 2012-10-05 ENCOUNTER — Ambulatory Visit (HOSPITAL_BASED_OUTPATIENT_CLINIC_OR_DEPARTMENT_OTHER)
Admission: RE | Admit: 2012-10-05 | Discharge: 2012-10-05 | Disposition: A | Discharge status: Home / Self Care | Payer: Medicare Other | Source: Ambulatory Visit | Attending: Urology | Admitting: Urology

## 2012-10-05 ENCOUNTER — Encounter (HOSPITAL_BASED_OUTPATIENT_CLINIC_OR_DEPARTMENT_OTHER)
Admission: RE | Disposition: A | Discharge status: Home / Self Care | Payer: Self-pay | Source: Ambulatory Visit | Attending: Urology

## 2012-10-05 ENCOUNTER — Encounter (HOSPITAL_BASED_OUTPATIENT_CLINIC_OR_DEPARTMENT_OTHER): Payer: Self-pay | Admitting: Anesthesiology

## 2012-10-05 ENCOUNTER — Ambulatory Visit (HOSPITAL_BASED_OUTPATIENT_CLINIC_OR_DEPARTMENT_OTHER): Payer: Medicare Other | Admitting: Anesthesiology

## 2012-10-05 ENCOUNTER — Other Ambulatory Visit: Payer: Self-pay

## 2012-10-05 DIAGNOSIS — G4733 Obstructive sleep apnea (adult) (pediatric): Secondary | ICD-10-CM | POA: Insufficient documentation

## 2012-10-05 DIAGNOSIS — I10 Essential (primary) hypertension: Secondary | ICD-10-CM | POA: Insufficient documentation

## 2012-10-05 DIAGNOSIS — C679 Malignant neoplasm of bladder, unspecified: Secondary | ICD-10-CM | POA: Insufficient documentation

## 2012-10-05 DIAGNOSIS — N3020 Other chronic cystitis without hematuria: Secondary | ICD-10-CM | POA: Insufficient documentation

## 2012-10-05 DIAGNOSIS — Z79899 Other long term (current) drug therapy: Secondary | ICD-10-CM | POA: Insufficient documentation

## 2012-10-05 HISTORY — PX: CYSTOSCOPY WITH BIOPSY: SHX5122

## 2012-10-05 LAB — POCT I-STAT 4, (NA,K, GLUC, HGB,HCT)
HCT: 38 % — ABNORMAL LOW (ref 39.0–52.0)
Sodium: 141 mEq/L (ref 135–145)

## 2012-10-05 SURGERY — CYSTOSCOPY, WITH BIOPSY
Anesthesia: General | Site: Bladder | Wound class: Clean Contaminated

## 2012-10-05 MED ORDER — ONDANSETRON HCL 4 MG/2ML IJ SOLN
INTRAMUSCULAR | Status: DC | PRN
  Administered 2012-10-05: 4 mg via INTRAVENOUS

## 2012-10-05 MED ORDER — OXYCODONE HCL 5 MG PO TABS
5.0000 mg | ORAL_TABLET | ORAL | Status: DC | PRN
  Filled 2012-10-05: qty 2

## 2012-10-05 MED ORDER — PROMETHAZINE HCL 25 MG/ML IJ SOLN
6.2500 mg | INTRAMUSCULAR | Status: DC | PRN
  Filled 2012-10-05: qty 1

## 2012-10-05 MED ORDER — MIDAZOLAM HCL 5 MG/5ML IJ SOLN
INTRAMUSCULAR | Status: DC | PRN
  Administered 2012-10-05 (×2): 1 mg via INTRAVENOUS

## 2012-10-05 MED ORDER — LACTATED RINGERS IV SOLN
INTRAVENOUS | Status: DC
  Administered 2012-10-05 (×2): via INTRAVENOUS
  Filled 2012-10-05: qty 1000

## 2012-10-05 MED ORDER — FENTANYL CITRATE 0.05 MG/ML IJ SOLN
INTRAMUSCULAR | Status: DC | PRN
  Administered 2012-10-05 (×2): 25 ug via INTRAVENOUS
  Administered 2012-10-05: 50 ug via INTRAVENOUS

## 2012-10-05 MED ORDER — MEPERIDINE HCL 25 MG/ML IJ SOLN
6.2500 mg | INTRAMUSCULAR | Status: DC | PRN
  Filled 2012-10-05: qty 1

## 2012-10-05 MED ORDER — CIPROFLOXACIN IN D5W 400 MG/200ML IV SOLN
400.0000 mg | INTRAVENOUS | Status: AC
  Administered 2012-10-05: 400 mg via INTRAVENOUS
  Filled 2012-10-05: qty 200

## 2012-10-05 MED ORDER — SODIUM CHLORIDE 0.9 % IJ SOLN
3.0000 mL | Freq: Two times a day (BID) | INTRAMUSCULAR | Status: DC
  Filled 2012-10-05: qty 3

## 2012-10-05 MED ORDER — LIDOCAINE HCL (CARDIAC) 20 MG/ML IV SOLN
INTRAVENOUS | Status: DC | PRN
  Administered 2012-10-05: 100 mg via INTRAVENOUS

## 2012-10-05 MED ORDER — KETOROLAC TROMETHAMINE 30 MG/ML IJ SOLN
INTRAMUSCULAR | Status: DC | PRN
  Administered 2012-10-05: 30 mg via INTRAVENOUS

## 2012-10-05 MED ORDER — LACTATED RINGERS IV SOLN
INTRAVENOUS | Status: DC | PRN
  Administered 2012-10-05: 09:00:00 via INTRAVENOUS

## 2012-10-05 MED ORDER — BELLADONNA ALKALOIDS-OPIUM 16.2-60 MG RE SUPP
RECTAL | Status: DC | PRN
  Administered 2012-10-05: 1 via RECTAL

## 2012-10-05 MED ORDER — MORPHINE SULFATE 2 MG/ML IJ SOLN
1.0000 mg | INTRAMUSCULAR | Status: DC | PRN
  Filled 2012-10-05: qty 1

## 2012-10-05 MED ORDER — ONDANSETRON HCL 4 MG/2ML IJ SOLN
4.0000 mg | Freq: Four times a day (QID) | INTRAMUSCULAR | Status: DC | PRN
  Filled 2012-10-05: qty 2

## 2012-10-05 MED ORDER — SODIUM CHLORIDE 0.9 % IV SOLN
250.0000 mL | INTRAVENOUS | Status: DC | PRN
  Filled 2012-10-05: qty 250

## 2012-10-05 MED ORDER — ACETAMINOPHEN 325 MG PO TABS
650.0000 mg | ORAL_TABLET | ORAL | Status: DC | PRN
  Filled 2012-10-05: qty 2

## 2012-10-05 MED ORDER — ACETAMINOPHEN 650 MG RE SUPP
650.0000 mg | RECTAL | Status: DC | PRN
  Filled 2012-10-05: qty 1

## 2012-10-05 MED ORDER — OXYCODONE HCL 5 MG PO TABS
5.0000 mg | ORAL_TABLET | Freq: Once | ORAL | Status: DC | PRN
  Filled 2012-10-05: qty 1

## 2012-10-05 MED ORDER — SODIUM CHLORIDE 0.9 % IJ SOLN
3.0000 mL | INTRAMUSCULAR | Status: DC | PRN
  Filled 2012-10-05: qty 3

## 2012-10-05 MED ORDER — STERILE WATER FOR IRRIGATION IR SOLN
Status: DC | PRN
  Administered 2012-10-05: 3000 mL via INTRAVESICAL

## 2012-10-05 MED ORDER — ACETAMINOPHEN 10 MG/ML IV SOLN
1000.0000 mg | Freq: Once | INTRAVENOUS | Status: DC | PRN
  Filled 2012-10-05: qty 100

## 2012-10-05 MED ORDER — DEXAMETHASONE SODIUM PHOSPHATE 4 MG/ML IJ SOLN
INTRAMUSCULAR | Status: DC | PRN
  Administered 2012-10-05: 10 mg via INTRAVENOUS

## 2012-10-05 MED ORDER — OXYCODONE HCL 5 MG/5ML PO SOLN
5.0000 mg | Freq: Once | ORAL | Status: DC | PRN
  Filled 2012-10-05: qty 5

## 2012-10-05 MED ORDER — HYDROMORPHONE HCL PF 1 MG/ML IJ SOLN
0.2500 mg | INTRAMUSCULAR | Status: DC | PRN
  Filled 2012-10-05: qty 1

## 2012-10-05 MED ORDER — CIPROFLOXACIN HCL 250 MG PO TABS: 250.0000 mg | ORAL_TABLET | Freq: Two times a day (BID) | ORAL | Status: DC

## 2012-10-05 MED ORDER — HYDROCODONE-ACETAMINOPHEN 5-325 MG PO TABS: 1.0000 | ORAL_TABLET | ORAL | Status: DC | PRN

## 2012-10-05 MED ORDER — PROPOFOL 10 MG/ML IV BOLUS
INTRAVENOUS | Status: DC | PRN
  Administered 2012-10-05: 200 mg via INTRAVENOUS

## 2012-10-05 SURGICAL SUPPLY — 23 items
BAG DRAIN URO-CYSTO SKYTR STRL (DRAIN) ×2 IMPLANT
BAG DRN ANRFLXCHMBR STRAP LEK (BAG) ×1
BAG DRN UROCATH (DRAIN) ×1
BAG URINE LEG 19OZ MD ST LTX (BAG) ×2 IMPLANT
CANISTER SUCT LVC 12 LTR MEDI- (MISCELLANEOUS) ×2 IMPLANT
CATH FOLEY 2WAY SLVR  5CC 18FR (CATHETERS) ×1
CATH FOLEY 2WAY SLVR 5CC 18FR (CATHETERS) ×1 IMPLANT
CLOTH BEACON ORANGE TIMEOUT ST (SAFETY) ×2 IMPLANT
DRAPE CAMERA CLOSED 9X96 (DRAPES) ×2 IMPLANT
ELECT REM PT RETURN 9FT ADLT (ELECTROSURGICAL) ×2
ELECTRODE REM PT RTRN 9FT ADLT (ELECTROSURGICAL) ×1 IMPLANT
GLOVE BIO SURGEON STRL SZ 6.5 (GLOVE) ×2 IMPLANT
GLOVE BIO SURGEON STRL SZ8 (GLOVE) ×2 IMPLANT
GLOVE INDICATOR 7.0 STRL GRN (GLOVE) ×2 IMPLANT
GOWN PREVENTION PLUS LG XLONG (DISPOSABLE) ×2 IMPLANT
GOWN STRL REIN XL XLG (GOWN DISPOSABLE) ×2 IMPLANT
NDL SAFETY ECLIPSE 18X1.5 (NEEDLE) IMPLANT
NEEDLE HYPO 18GX1.5 SHARP (NEEDLE)
NEEDLE HYPO 22GX1.5 SAFETY (NEEDLE) IMPLANT
NS IRRIG 500ML POUR BTL (IV SOLUTION) IMPLANT
PACK CYSTOSCOPY (CUSTOM PROCEDURE TRAY) ×2 IMPLANT
SYR 20CC LL (SYRINGE) IMPLANT
WATER STERILE IRR 3000ML UROMA (IV SOLUTION) ×2 IMPLANT

## 2012-10-05 NOTE — Anesthesia Postprocedure Evaluation (Signed)
Anesthesia Post Note  Patient: Donald [name]  Procedure(s) Performed: Procedure(s) (LRB): CYSTOSCOPY WITH BIOPSY of bladder and bladder washings (N/A)  Anesthesia type: General  Patient location: PACU  Post pain: Pain level controlled  Post assessment: Post-op Vital signs reviewed  Last Vitals: BP 114/71  Pulse 50  Temp(Src) 36.4 C (Oral)  Resp 12  Ht 5\' 8"  (1.727 m)  Wt 221 lb (100.245 kg)  BMI 33.61 kg/m2  SpO2 98%  Post vital signs: Reviewed  Level of consciousness: sedated  Complications: No apparent anesthesia complications

## 2012-10-05 NOTE — Anesthesia Procedure Notes (Signed)
Procedure Name: LMA Insertion Date/Time: 10/05/2012 9:23 AM Performed by: Jessica Priest Pre-anesthesia Checklist: Patient identified, Emergency Drugs available, Suction available and Patient being monitored Patient Re-evaluated:Patient Re-evaluated prior to inductionOxygen Delivery Method: Circle System Utilized Preoxygenation: Pre-oxygenation with 100% oxygen Intubation Type: IV induction Ventilation: Mask ventilation without difficulty LMA: LMA inserted LMA Size: 5.0 Number of attempts: 1 Airway Equipment and Method: bite block Placement Confirmation: positive ETCO2 Tube secured with: Tape Dental Injury: Teeth and Oropharynx as per pre-operative assessment

## 2012-10-05 NOTE — Transfer of Care (Signed)
Immediate Anesthesia Transfer of Care Note  Patient: Donald [name]  Procedure(s) Performed: Procedure(s) (LRB): CYSTOSCOPY WITH BIOPSY of bladder and bladder washings (N/A)  Patient Location: PACU  Anesthesia Type: General  Level of Consciousness: awake, sedated, patient cooperative and responds to stimulation  Airway & Oxygen Therapy: Patient Spontanous Breathing and Patient connected to face mask oxygen  Post-op Assessment: Report given to PACU RN, Post -op Vital signs reviewed and stable and Patient moving all extremities  Post vital signs: Reviewed and stable  Complications: No apparent anesthesia complications

## 2012-10-05 NOTE — Op Note (Signed)
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 Shaneya Taketa, M.D.   Anesthesia: Gen.   Complications: None  Specimen(s): 1. Bladder washings 2. Right bladder tumor 3. Posterior bladder biopsy  Drain(s): 18 French Foley catheter to leg bag  Indications: 67 year-old male with recent positive cytologies. He has had 2 prior bladder biopsies as well as history of BCG. Recent cystoscopy was negative, but he did have positive FISH test. He presents at this time for cystoscopy and biopsies. Recent CT hematuria protocol was negative.    Technique and findings: The patient was properly identified in the holding area. He received preoperative IV antibiotics. He was taken the operating room where general anesthesia was administered with the LMA. He was placed in the dorsolithotomy position. Genitalia and perineum were prepped and draped. Timeout was then performed.  A 22 French panendoscope was advanced through his urethra. There is a minimal stricture at his bulbous urethra which was easily passed with the beak of the scope. Prostate was relatively nonobstructive but he did have a prominent median lobe. His bladder was entered. It took saline washings at first. These were sent as cytologies. I then inspected the bladder circumferentially both the 12 and 70 lenses. There was a minimal erythematous area posteriorly in the midline, approximately 2 cm above the trigone. There was a small 1 cm papillary lesion just inferior to the right ureteral orifice. I saw no other significant lesions. I took 3 excisional biopsies with the cold cup forceps of the right-sided bladder tumor. Following removal, although there was some mild bleeding in this area, I did not see any remaining urothelial abnormality. I used the Bugbee electrode to  cauterize this area, taking care to stay away from the right ureteral orifice. Following this, there was adequate hemostasis. This biopsy was labeled "bladder tumor" and was on the right side. I then used the same cold cup forceps to take 2 biopsies of the erythematous area which was only slight when compared to surrounding urothelium, posteriorly. These 2 biopsies were labeled posterior bladder. Again, I cauterized the biopsied sites following this. I reinspected the bladder. No bleeding was seen. Again, no other urothelial lesions were noted. At this point the bladder was drained with an 13 French Foley catheter, the balloon filled with 10 cc of water and this was hooked to dependent drainage. A B. and O. suppository was placed. The patient was awakened and taken to the PACU in stable condition.

## 2012-10-05 NOTE — Anesthesia Preprocedure Evaluation (Signed)
Anesthesia Evaluation  Patient identified by MRN, date of birth, ID band Patient awake    Reviewed: Allergy & Precautions, H&P , NPO status , Patient's Chart, lab work & pertinent test results  Airway Mallampati: II TM Distance: >3 FB Neck ROM: Full    Dental  (+) Teeth Intact, Poor Dentition and Dental Advisory Given   Pulmonary neg pulmonary ROS, sleep apnea ,  breath sounds clear to auscultation  Pulmonary exam normal       Cardiovascular hypertension, Pt. on medications negative cardio ROS  Rhythm:Regular Rate:Normal     Neuro/Psych negative neurological ROS  negative psych ROS   GI/Hepatic Neg liver ROS, GERD-  Medicated,  Endo/Other  Morbid obesity  Renal/GU negative Renal ROS     Musculoskeletal negative musculoskeletal ROS (+)   Abdominal (+) + obese,   Peds  Hematology negative hematology ROS (+)   Anesthesia Other Findings   Reproductive/Obstetrics                           Anesthesia Physical  Anesthesia Plan  ASA: II  Anesthesia Plan: General   Post-op Pain Management:    Induction: Intravenous  Airway Management Planned: LMA  Additional Equipment:   Intra-op Plan:   Post-operative Plan: Extubation in OR  Informed Consent: I have reviewed the patients History and Physical, chart, labs and discussed the procedure including the risks, benefits and alternatives for the proposed anesthesia with the patient or authorized representative who has indicated his/her understanding and acceptance.   Dental advisory given  Plan Discussed with: CRNA  Anesthesia Plan Comments:         Anesthesia Quick Evaluation
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
## 2012-10-06 ENCOUNTER — Encounter (HOSPITAL_BASED_OUTPATIENT_CLINIC_OR_DEPARTMENT_OTHER): Payer: Self-pay | Admitting: Urology

## 2014-03-28 ENCOUNTER — Other Ambulatory Visit: Payer: Self-pay | Admitting: Urology

## 2014-04-21 ENCOUNTER — Encounter (HOSPITAL_BASED_OUTPATIENT_CLINIC_OR_DEPARTMENT_OTHER): Payer: Self-pay | Admitting: *Deleted

## 2014-04-21 NOTE — Progress Notes (Signed)
   04/21/14 1146  OBSTRUCTIVE SLEEP APNEA  Have you ever been diagnosed with sleep apnea through a sleep study? No  Do you snore loudly (loud enough to be heard through closed doors)?  1  Do you often feel tired, fatigued, or sleepy during the daytime? 0  Has anyone observed you stop breathing during your sleep? 1  Do you have, or are you being treated for high blood pressure? 1  BMI more than 35 kg/m2? 0  Age over 69 years old? 1  Neck circumference greater than 40 cm/16 inches? 1  Gender: 1  Obstructive Sleep Apnea Score 6  Score 4 or greater  Results sent to PCP

## 2014-04-21 NOTE — Progress Notes (Signed)
SPOKE W/ WIFE. NPO AFTER MN. ARRIVE AT 0830. NEEDS ISTAT AND EKG. WILL TAKE AM MEDS WITH EXCEPTION NO HCTZ DOS W/ SIPS OF WATER .

## 2014-04-25 ENCOUNTER — Encounter (HOSPITAL_BASED_OUTPATIENT_CLINIC_OR_DEPARTMENT_OTHER)
Admission: RE | Disposition: A | Discharge status: Home / Self Care | Payer: Self-pay | Source: Ambulatory Visit | Attending: Urology

## 2014-04-25 ENCOUNTER — Ambulatory Visit (HOSPITAL_BASED_OUTPATIENT_CLINIC_OR_DEPARTMENT_OTHER): Payer: Medicare Other | Admitting: Anesthesiology

## 2014-04-25 ENCOUNTER — Encounter (HOSPITAL_BASED_OUTPATIENT_CLINIC_OR_DEPARTMENT_OTHER): Payer: Self-pay | Admitting: *Deleted

## 2014-04-25 ENCOUNTER — Ambulatory Visit (HOSPITAL_BASED_OUTPATIENT_CLINIC_OR_DEPARTMENT_OTHER)
Admission: RE | Admit: 2014-04-25 | Discharge: 2014-04-25 | Disposition: A | Discharge status: Home / Self Care | Payer: Medicare Other | Source: Ambulatory Visit | Attending: Urology | Admitting: Urology

## 2014-04-25 DIAGNOSIS — M109 Gout, unspecified: Secondary | ICD-10-CM | POA: Insufficient documentation

## 2014-04-25 DIAGNOSIS — C679 Malignant neoplasm of bladder, unspecified: Secondary | ICD-10-CM | POA: Insufficient documentation

## 2014-04-25 DIAGNOSIS — G4733 Obstructive sleep apnea (adult) (pediatric): Secondary | ICD-10-CM | POA: Diagnosis not present

## 2014-04-25 DIAGNOSIS — I493 Ventricular premature depolarization: Secondary | ICD-10-CM | POA: Insufficient documentation

## 2014-04-25 DIAGNOSIS — Z8551 Personal history of malignant neoplasm of bladder: Secondary | ICD-10-CM

## 2014-04-25 DIAGNOSIS — E785 Hyperlipidemia, unspecified: Secondary | ICD-10-CM | POA: Insufficient documentation

## 2014-04-25 DIAGNOSIS — I10 Essential (primary) hypertension: Secondary | ICD-10-CM | POA: Insufficient documentation

## 2014-04-25 DIAGNOSIS — K219 Gastro-esophageal reflux disease without esophagitis: Secondary | ICD-10-CM | POA: Diagnosis not present

## 2014-04-25 DIAGNOSIS — Z6832 Body mass index (BMI) 32.0-32.9, adult: Secondary | ICD-10-CM | POA: Diagnosis not present

## 2014-04-25 DIAGNOSIS — E669 Obesity, unspecified: Secondary | ICD-10-CM | POA: Insufficient documentation

## 2014-04-25 DIAGNOSIS — Z87891 Personal history of nicotine dependence: Secondary | ICD-10-CM | POA: Diagnosis not present

## 2014-04-25 HISTORY — DX: Personal history of malignant neoplasm of bladder: Z85.51

## 2014-04-25 HISTORY — PX: CYSTOSCOPY WITH BIOPSY: SHX5122

## 2014-04-25 HISTORY — PX: CYSTOSCOPY W/ RETROGRADES: SHX1426

## 2014-04-25 HISTORY — DX: Other specified personal risk factors, not elsewhere classified: Z91.89

## 2014-04-25 LAB — POCT I-STAT, CHEM 8
BUN: 24 mg/dL — AB (ref 6–23)
CALCIUM ION: 1.3 mmol/L (ref 1.13–1.30)
Chloride: 104 mEq/L (ref 96–112)
Creatinine, Ser: 1 mg/dL (ref 0.50–1.35)
GLUCOSE: 118 mg/dL — AB (ref 70–99)
HEMATOCRIT: 40 % (ref 39.0–52.0)
Hemoglobin: 13.6 g/dL (ref 13.0–17.0)
POTASSIUM: 4.1 mmol/L (ref 3.5–5.1)
Sodium: 141 mmol/L (ref 135–145)
TCO2: 24 mmol/L (ref 0–100)

## 2014-04-25 SURGERY — CYSTOSCOPY, WITH RETROGRADE PYELOGRAM
Anesthesia: General | Site: Ureter

## 2014-04-25 MED ORDER — FENTANYL CITRATE 0.05 MG/ML IJ SOLN
25.0000 ug | INTRAMUSCULAR | Status: DC | PRN
  Filled 2014-04-25: qty 1

## 2014-04-25 MED ORDER — ACETAMINOPHEN 10 MG/ML IV SOLN
INTRAVENOUS | Status: DC | PRN
  Administered 2014-04-25: 1000 mg via INTRAVENOUS

## 2014-04-25 MED ORDER — CIPROFLOXACIN IN D5W 400 MG/200ML IV SOLN
INTRAVENOUS | Status: AC
  Filled 2014-04-25: qty 200

## 2014-04-25 MED ORDER — PROMETHAZINE HCL 25 MG/ML IJ SOLN
6.2500 mg | INTRAMUSCULAR | Status: DC | PRN
  Filled 2014-04-25: qty 1

## 2014-04-25 MED ORDER — SODIUM CHLORIDE 0.9 % IR SOLN
Status: DC | PRN
  Administered 2014-04-25: 1000 mL

## 2014-04-25 MED ORDER — FENTANYL CITRATE 0.05 MG/ML IJ SOLN
INTRAMUSCULAR | Status: DC | PRN
  Administered 2014-04-25: 50 ug via INTRAVENOUS

## 2014-04-25 MED ORDER — IOHEXOL 350 MG/ML SOLN
INTRAVENOUS | Status: DC | PRN
  Administered 2014-04-25: 38 mL via URETHRAL

## 2014-04-25 MED ORDER — LIDOCAINE HCL (CARDIAC) 20 MG/ML IV SOLN
INTRAVENOUS | Status: DC | PRN
  Administered 2014-04-25: 100 mg via INTRAVENOUS

## 2014-04-25 MED ORDER — STERILE WATER FOR IRRIGATION IR SOLN
Status: DC | PRN
  Administered 2014-04-25: 3000 mL

## 2014-04-25 MED ORDER — CIPROFLOXACIN HCL 250 MG PO TABS: 250.0000 mg | ORAL_TABLET | Freq: Two times a day (BID) | ORAL | Status: DC

## 2014-04-25 MED ORDER — MIDAZOLAM HCL 5 MG/5ML IJ SOLN
INTRAMUSCULAR | Status: DC | PRN
  Administered 2014-04-25: 1 mg via INTRAVENOUS

## 2014-04-25 MED ORDER — MIDAZOLAM HCL 2 MG/2ML IJ SOLN
INTRAMUSCULAR | Status: AC
  Filled 2014-04-25: qty 2

## 2014-04-25 MED ORDER — ONDANSETRON HCL 4 MG/2ML IJ SOLN
INTRAMUSCULAR | Status: DC | PRN
  Administered 2014-04-25: 4 mg via INTRAVENOUS

## 2014-04-25 MED ORDER — LACTATED RINGERS IV SOLN
INTRAVENOUS | Status: DC
  Administered 2014-04-25: 09:00:00 via INTRAVENOUS
  Filled 2014-04-25: qty 1000

## 2014-04-25 MED ORDER — CIPROFLOXACIN IN D5W 400 MG/200ML IV SOLN
400.0000 mg | INTRAVENOUS | Status: AC
  Administered 2014-04-25: 400 mg via INTRAVENOUS
  Filled 2014-04-25: qty 200

## 2014-04-25 MED ORDER — PROPOFOL 10 MG/ML IV BOLUS
INTRAVENOUS | Status: DC | PRN
  Administered 2014-04-25: 260 mg via INTRAVENOUS

## 2014-04-25 MED ORDER — FENTANYL CITRATE 0.05 MG/ML IJ SOLN
INTRAMUSCULAR | Status: AC
  Filled 2014-04-25: qty 4

## 2014-04-25 MED ORDER — DEXAMETHASONE SODIUM PHOSPHATE 4 MG/ML IJ SOLN
INTRAMUSCULAR | Status: DC | PRN
  Administered 2014-04-25: 10 mg via INTRAVENOUS

## 2014-04-25 SURGICAL SUPPLY — 32 items
ADAPTER CATH URET PLST 4-6FR (CATHETERS) IMPLANT
ADPR CATH URET STRL DISP 4-6FR (CATHETERS)
BAG DRAIN URO-CYSTO SKYTR STRL (DRAIN) ×3 IMPLANT
BAG DRN UROCATH (DRAIN) ×2
CANISTER SUCT LVC 12 LTR MEDI- (MISCELLANEOUS) ×3 IMPLANT
CATH INTERMIT  6FR 70CM (CATHETERS) ×3 IMPLANT
CATH URET 5FR 28IN CONE TIP (BALLOONS)
CATH URET 5FR 28IN OPEN ENDED (CATHETERS) IMPLANT
CATH URET 5FR 70CM CONE TIP (BALLOONS) IMPLANT
CLOTH BEACON ORANGE TIMEOUT ST (SAFETY) ×3 IMPLANT
CONT SPECI 4OZ STER CLIK (MISCELLANEOUS) ×6 IMPLANT
DRAPE CAMERA CLOSED 9X96 (DRAPES) ×3 IMPLANT
ELECT REM PT RETURN 9FT ADLT (ELECTROSURGICAL) ×3
ELECTRODE REM PT RTRN 9FT ADLT (ELECTROSURGICAL) ×2 IMPLANT
GLOVE BIO SURGEON STRL SZ 6.5 (GLOVE) ×3 IMPLANT
GLOVE BIO SURGEON STRL SZ8 (GLOVE) ×3 IMPLANT
GLOVE INDICATOR 6.5 STRL GRN (GLOVE) ×3 IMPLANT
GOWN PREVENTION PLUS LG XLONG (DISPOSABLE) IMPLANT
GOWN STRL REIN XL XLG (GOWN DISPOSABLE) IMPLANT
GOWN STRL REUS W/TWL LRG LVL3 (GOWN DISPOSABLE) ×3 IMPLANT
GOWN STRL REUS W/TWL XL LVL3 (GOWN DISPOSABLE) ×3 IMPLANT
GUIDEWIRE 0.038 PTFE COATED (WIRE) IMPLANT
GUIDEWIRE ANG ZIPWIRE 038X150 (WIRE) IMPLANT
GUIDEWIRE STR DUAL SENSOR (WIRE) IMPLANT
NDL SAFETY ECLIPSE 18X1.5 (NEEDLE) IMPLANT
NEEDLE HYPO 18GX1.5 SHARP (NEEDLE)
NEEDLE HYPO 22GX1.5 SAFETY (NEEDLE) IMPLANT
NS IRRIG 500ML POUR BTL (IV SOLUTION) ×3 IMPLANT
PACK CYSTO (CUSTOM PROCEDURE TRAY) ×3 IMPLANT
SYR 20CC LL (SYRINGE) IMPLANT
SYRINGE IRR TOOMEY STRL 70CC (SYRINGE) ×3 IMPLANT
WATER STERILE IRR 3000ML UROMA (IV SOLUTION) ×3 IMPLANT

## 2014-04-25 NOTE — Anesthesia Procedure Notes (Signed)
Procedure Name: LMA Insertion Date/Time: 04/25/2014 10:20 AM Performed by: Bethena Roys T Pre-anesthesia Checklist: Patient identified, Emergency Drugs available, Suction available and Patient being monitored Patient Re-evaluated:Patient Re-evaluated prior to inductionOxygen Delivery Method: Circle System Utilized Preoxygenation: Pre-oxygenation with 100% oxygen Intubation Type: IV induction Ventilation: Mask ventilation without difficulty LMA: LMA with gastric port inserted LMA Size: 5.0 Number of attempts: 1 Placement Confirmation: positive ETCO2 Tube secured with: Tape Dental Injury: Teeth and Oropharynx as per pre-operative assessment

## 2014-04-25 NOTE — Discharge Instructions (Addendum)
1. You may see some blood in the urine and may have some burning with urination for 48-72 hours. You also may notice that you have to urinate more frequently or urgently after your procedure which is normal.  2. You should call should you develop an inability urinate, fever > 101, persistent nausea and vomiting that prevents you from eating or drinking to stay hydrated.  3. If you have a stent, you will likely urinate more frequently and urgently until the stent is removed and you may experience some discomfort/pain in the lower abdomen and flank especially when urinating. You may take pain medication prescribed to you if needed for pain. You may also intermittently have blood in the urine until the stent is removed.If you have a catheter, you will be taught how to take care of the catheter by the nursing staff prior to discharge from the hospital.  You may periodically feel a strong urge to void with the catheter in place.  This is a bladder spasm and most often can occur when having a bowel movement or moving around. It is typically self-limited and usually will stop after a few minutes.  You may use some Vaseline or Neosporin around the tip of the catheter to reduce friction at the tip of the penis. You may also see some blood in the urine.  A very small amount of blood can make the urine look quite red.  As long as the catheter is draining well, there usually is not a problem.  However, if the catheter is not draining well and is bloody, you should call the office [phone]) to notify us.  Post Anesthesia Home Care Instructions  Activity: Get plenty of rest for the remainder of the day. A responsible adult should stay with you for 24 hours following the procedure.  For the next 24 hours, DO NOT: -Drive a car -Paediatric nurse -Drink alcoholic beverages -Take any medication unless instructed by your physician -Make any legal decisions or sign important papers.  Meals: Start with liquid foods  such as gelatin or soup. Progress to regular foods as tolerated. Avoid greasy, spicy, heavy foods. If nausea and/or vomiting occur, drink only clear liquids until the nausea and/or vomiting subsides. Call your physician if vomiting continues.  Special Instructions/Symptoms: Your throat may feel dry or sore from the anesthesia or the breathing tube placed in your throat during surgery. If this causes discomfort, gargle with warm salt water. The discomfort should disappear within 24 hours.

## 2014-04-25 NOTE — Anesthesia Postprocedure Evaluation (Signed)
  Anesthesia Post-op Note  Patient: Donald [name]  Procedure(s) Performed: Procedure(s) (LRB): CYSTOSCOPY WITH RETROGRADE PYELOGRAM (Bilateral) CYSTOSCOPY WITH BIOPSY (N/A)  Patient Location: PACU  Anesthesia Type: general  Level of Consciousness: awake and alert   Airway and Oxygen Therapy: Patient Spontanous Breathing  Post-op Pain: mild  Post-op Assessment: Post-op Vital signs reviewed, Patient's Cardiovascular Status Stable, Respiratory Function Stable, Patent Airway and No signs of Nausea or vomiting  Last Vitals:  Filed Vitals:   04/25/14 1300  BP:   Pulse: 69  Temp: 36.4 C  Resp: 17    Post-op Vital Signs: stable   Complications: No apparent anesthesia complications

## 2014-04-25 NOTE — Anesthesia Preprocedure Evaluation (Signed)
Anesthesia Evaluation  Patient identified by MRN, date of birth, ID band Patient awake    Reviewed: Allergy & Precautions, NPO status , Patient's Chart, lab work & pertinent test results  Airway Mallampati: II  TM Distance: >3 FB Neck ROM: Full    Dental  (+) Poor Dentition, Dental Advisory Given   Pulmonary sleep apnea , former smoker,  breath sounds clear to auscultation  Pulmonary exam normal       Cardiovascular hypertension, Pt. on medications Rhythm:Regular Rate:Normal     Neuro/Psych negative neurological ROS  negative psych ROS   GI/Hepatic Neg liver ROS, GERD-  ,  Endo/Other  negative endocrine ROS  Renal/GU negative Renal ROS  negative genitourinary   Musculoskeletal negative musculoskeletal ROS (+)   Abdominal (+) + obese,   Peds negative pediatric ROS (+)  Hematology negative hematology ROS (+)   Anesthesia Other Findings   Reproductive/Obstetrics negative OB ROS                             Anesthesia Physical Anesthesia Plan  ASA: II  Anesthesia Plan: General   Post-op Pain Management:    Induction: Intravenous  Airway Management Planned: LMA  Additional Equipment:   Intra-op Plan:   Post-operative Plan: Extubation in OR  Informed Consent: I have reviewed the patients History and Physical, chart, labs and discussed the procedure including the risks, benefits and alternatives for the proposed anesthesia with the patient or authorized representative who has indicated his/her understanding and acceptance.   Dental advisory given  Plan Discussed with: CRNA  Anesthesia Plan Comments: ( LMA#5 last time)        Anesthesia Quick Evaluation

## 2014-04-25 NOTE — Transfer of Care (Signed)
Immediate Anesthesia Transfer of Care Note  Patient: Donald [name]  Procedure(s) Performed: Procedure(s): CYSTOSCOPY WITH RETROGRADE PYELOGRAM (Bilateral) CYSTOSCOPY WITH BIOPSY (N/A)  Patient Location: PACU  Anesthesia Type:General  Level of Consciousness: awake, alert  and oriented  Airway & Oxygen Therapy: Patient Spontanous Breathing and Patient connected to nasal cannula oxygen  Post-op Assessment: Report given to PACU RN  Post vital signs: Reviewed and stable  Complications: No apparent anesthesia complications
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
## 2014-04-25 NOTE — Op Note (Signed)
PATIENT:  Donald [name]  PRE-OPERATIVE DIAGNOSIS: History of bladder cancer with positive cytologies  POST-OPERATIVE DIAGNOSIS: Same  PROCEDURE: Cystoscopy, bilateral retrograde ureteropyelograms with interpretive fluoroscopy, bilateral renal washings, bladder biopsies, bladder barbotaged  SURGEON:  Lillette Boxer. Dua Mehler, M.D.  ANESTHESIA:  General  EBL:  Minimal  DRAINS: None  LOCAL MEDICATIONS USED:  None  SPECIMEN:  1 bladder washings for cytology 2. Left renal washings for cytology 3. Right renal washings for cytology 4. Left bladder wall biopsy  INDICATION: Donald [name] is a 69 year old male with recent positive cytologies. He has a history significant for prior resection of non-muscle invasive bladder cancer. He presents at this time for cystoscopy, retrogrades, renal washings and bladder biopsy.  Description of procedure: The patient was properly identified and marked (if applicable) in the holding area. They were then  taken to the operating room and placed on the table in a supine position. General anesthesia was then administered. Once fully anesthetized the patient was moved to the dorsolithotomy position and the genitalia and perineum were sterilely prepped and draped in standard fashion. An official timeout was then performed.   A 22 French panendoscope was advanced through his urethra using the 12 lens. Urethra was without stricture or lesion. There was an enlarged median lobe with an intravesical median bar. There was no significant enlargement of the lateral lobes. The bladder was entered and inspected circumferentially. No significant trabeculations were noted. Urothelium appeared normal except for a patch of erythematous urothelium on the left bladder wall, just superior to the left ureteral orifice. This was adjacent to an area that had been resected initially several years ago. No other urothelial lesions or erythema were noted of the bladder wall. The bladder was  inspected using both the 12 and 70 lenses.  A 6 French open-ended catheter was advanced up into the left renal pelvic area. Saline washings were taken and sent for cytology labeled "left renal washings". I then brought the open-ended catheter down to the ureterovesical junction on the left, and using Omnipaque performed a retrograde ureteropyelogram. The ureter was normal throughout its length without filling defects or hydronephrosis. No strictures were noted. Pyelo-calyceal system was normal. One small calyx in the lower pole took a while to fill, but was normal when filled.  A similar process was performed on the right side. Renal washings were taken, and labeled "right renal washings" for cytology. The retrograde was then performed, again revealing a normal pyelocalyceal system and ureter.  I then used the cold cup biopsy forceps to biopsy the lesion on the left side of the bladder. This was labeled "left bladder wall lesion". This was totally excised using the cold cup forceps. Following this, the Bugbee electrode was used to cauterize the biopsy site. Hemostasis was excellent.  The bladder was drained at this point the scope removed. I did not feel like a catheter needed to be left in.     PLAN OF CARE: Discharge to home after PACU  PATIENT DISPOSITION:  PACU - hemodynamically stable.

## 2014-04-26 ENCOUNTER — Encounter (HOSPITAL_BASED_OUTPATIENT_CLINIC_OR_DEPARTMENT_OTHER): Payer: Self-pay | Admitting: Urology

## 2015-04-06 ENCOUNTER — Other Ambulatory Visit: Payer: Self-pay | Admitting: Urology

## 2015-04-14 ENCOUNTER — Encounter (HOSPITAL_BASED_OUTPATIENT_CLINIC_OR_DEPARTMENT_OTHER): Payer: Self-pay | Admitting: *Deleted

## 2015-04-14 NOTE — Progress Notes (Signed)
To [facility] at 1015-Istat on arrival,Ekg with chart-Instructed Npo after Mn-will take labetalol,catapress with small amt water in am.

## 2015-04-21 ENCOUNTER — Encounter (HOSPITAL_BASED_OUTPATIENT_CLINIC_OR_DEPARTMENT_OTHER)
Admission: RE | Disposition: A | Discharge status: Home / Self Care | Payer: Self-pay | Source: Ambulatory Visit | Attending: Urology

## 2015-04-21 ENCOUNTER — Ambulatory Visit (HOSPITAL_BASED_OUTPATIENT_CLINIC_OR_DEPARTMENT_OTHER): Payer: Medicare Other | Admitting: Anesthesiology

## 2015-04-21 ENCOUNTER — Ambulatory Visit (HOSPITAL_BASED_OUTPATIENT_CLINIC_OR_DEPARTMENT_OTHER)
Admission: RE | Admit: 2015-04-21 | Discharge: 2015-04-21 | Disposition: A | Discharge status: Home / Self Care | Payer: Medicare Other | Source: Ambulatory Visit | Attending: Urology | Admitting: Urology

## 2015-04-21 ENCOUNTER — Encounter (HOSPITAL_BASED_OUTPATIENT_CLINIC_OR_DEPARTMENT_OTHER): Payer: Self-pay | Admitting: Anesthesiology

## 2015-04-21 DIAGNOSIS — N3080 Other cystitis without hematuria: Secondary | ICD-10-CM | POA: Insufficient documentation

## 2015-04-21 DIAGNOSIS — C679 Malignant neoplasm of bladder, unspecified: Secondary | ICD-10-CM | POA: Diagnosis present

## 2015-04-21 DIAGNOSIS — N3289 Other specified disorders of bladder: Secondary | ICD-10-CM | POA: Diagnosis not present

## 2015-04-21 DIAGNOSIS — Z87891 Personal history of nicotine dependence: Secondary | ICD-10-CM | POA: Diagnosis not present

## 2015-04-21 DIAGNOSIS — I10 Essential (primary) hypertension: Secondary | ICD-10-CM | POA: Diagnosis not present

## 2015-04-21 DIAGNOSIS — E785 Hyperlipidemia, unspecified: Secondary | ICD-10-CM | POA: Insufficient documentation

## 2015-04-21 DIAGNOSIS — Z8551 Personal history of malignant neoplasm of bladder: Secondary | ICD-10-CM | POA: Diagnosis not present

## 2015-04-21 DIAGNOSIS — G4733 Obstructive sleep apnea (adult) (pediatric): Secondary | ICD-10-CM | POA: Diagnosis not present

## 2015-04-21 DIAGNOSIS — Z7982 Long term (current) use of aspirin: Secondary | ICD-10-CM | POA: Insufficient documentation

## 2015-04-21 DIAGNOSIS — Z880 Allergy status to penicillin: Secondary | ICD-10-CM | POA: Diagnosis not present

## 2015-04-21 DIAGNOSIS — M109 Gout, unspecified: Secondary | ICD-10-CM | POA: Insufficient documentation

## 2015-04-21 DIAGNOSIS — K219 Gastro-esophageal reflux disease without esophagitis: Secondary | ICD-10-CM | POA: Diagnosis not present

## 2015-04-21 DIAGNOSIS — N400 Enlarged prostate without lower urinary tract symptoms: Secondary | ICD-10-CM | POA: Diagnosis not present

## 2015-04-21 DIAGNOSIS — C674 Malignant neoplasm of posterior wall of bladder: Secondary | ICD-10-CM

## 2015-04-21 HISTORY — PX: CYSTOSCOPY W/ RETROGRADES: SHX1426

## 2015-04-21 LAB — POCT I-STAT 4, (NA,K, GLUC, HGB,HCT)
GLUCOSE: 106 mg/dL — AB (ref 65–99)
HCT: 39 % (ref 39.0–52.0)
Hemoglobin: 13.3 g/dL (ref 13.0–17.0)
POTASSIUM: 5.5 mmol/L — AB (ref 3.5–5.1)
SODIUM: 139 mmol/L (ref 135–145)

## 2015-04-21 SURGERY — CYSTOSCOPY, WITH RETROGRADE PYELOGRAM
Anesthesia: General | Site: Ureter | Laterality: Bilateral

## 2015-04-21 MED ORDER — LIDOCAINE HCL (CARDIAC) 20 MG/ML IV SOLN
INTRAVENOUS | Status: AC
  Filled 2015-04-21: qty 5

## 2015-04-21 MED ORDER — DEXAMETHASONE SODIUM PHOSPHATE 4 MG/ML IJ SOLN
INTRAMUSCULAR | Status: DC | PRN
  Administered 2015-04-21: 10 mg via INTRAVENOUS

## 2015-04-21 MED ORDER — LACTATED RINGERS IV SOLN
INTRAVENOUS | Status: DC
  Filled 2015-04-21: qty 1000

## 2015-04-21 MED ORDER — PROPOFOL 10 MG/ML IV BOLUS
INTRAVENOUS | Status: DC | PRN
  Administered 2015-04-21: 200 mg via INTRAVENOUS

## 2015-04-21 MED ORDER — IOHEXOL 350 MG/ML SOLN
INTRAVENOUS | Status: DC | PRN
  Administered 2015-04-21: 20 mL

## 2015-04-21 MED ORDER — SODIUM CHLORIDE 0.9 % IR SOLN
Status: DC | PRN
  Administered 2015-04-21: 500 mL via INTRAVESICAL

## 2015-04-21 MED ORDER — STERILE WATER FOR IRRIGATION IR SOLN
Status: DC | PRN
  Administered 2015-04-21: 3000 mL

## 2015-04-21 MED ORDER — SULFAMETHOXAZOLE-TRIMETHOPRIM 800-160 MG PO TABS: 1.0000 | ORAL_TABLET | Freq: Two times a day (BID) | ORAL | Status: DC

## 2015-04-21 MED ORDER — CIPROFLOXACIN IN D5W 400 MG/200ML IV SOLN
INTRAVENOUS | Status: AC
  Filled 2015-04-21: qty 200

## 2015-04-21 MED ORDER — PROPOFOL 10 MG/ML IV BOLUS
INTRAVENOUS | Status: AC
  Filled 2015-04-21: qty 20

## 2015-04-21 MED ORDER — MIDAZOLAM HCL 5 MG/5ML IJ SOLN
INTRAMUSCULAR | Status: DC | PRN
Start: 2015-04-21 — End: 2015-04-21
  Administered 2015-04-21: 2 mg via INTRAVENOUS

## 2015-04-21 MED ORDER — LACTATED RINGERS IV SOLN
INTRAVENOUS | Status: DC
  Administered 2015-04-21: 11:00:00 via INTRAVENOUS
  Filled 2015-04-21: qty 1000

## 2015-04-21 MED ORDER — URIBEL 118 MG PO CAPS: 1.0000 | ORAL_CAPSULE | Freq: Three times a day (TID) | ORAL | Status: DC | PRN

## 2015-04-21 MED ORDER — FENTANYL CITRATE (PF) 100 MCG/2ML IJ SOLN
25.0000 ug | INTRAMUSCULAR | Status: DC | PRN
  Filled 2015-04-21: qty 1

## 2015-04-21 MED ORDER — LIDOCAINE HCL (CARDIAC) 20 MG/ML IV SOLN
INTRAVENOUS | Status: DC | PRN
  Administered 2015-04-21: 100 mg via INTRAVENOUS

## 2015-04-21 MED ORDER — ONDANSETRON HCL 4 MG/2ML IJ SOLN
INTRAMUSCULAR | Status: AC
  Filled 2015-04-21: qty 2

## 2015-04-21 MED ORDER — DEXAMETHASONE SODIUM PHOSPHATE 10 MG/ML IJ SOLN
INTRAMUSCULAR | Status: AC
  Filled 2015-04-21: qty 1

## 2015-04-21 MED ORDER — ONDANSETRON HCL 4 MG/2ML IJ SOLN
INTRAMUSCULAR | Status: DC | PRN
  Administered 2015-04-21: 4 mg via INTRAVENOUS

## 2015-04-21 MED ORDER — MIDAZOLAM HCL 2 MG/2ML IJ SOLN
INTRAMUSCULAR | Status: AC
  Filled 2015-04-21: qty 2

## 2015-04-21 MED ORDER — FENTANYL CITRATE (PF) 100 MCG/2ML IJ SOLN
INTRAMUSCULAR | Status: AC
  Filled 2015-04-21: qty 2

## 2015-04-21 MED ORDER — FENTANYL CITRATE (PF) 100 MCG/2ML IJ SOLN
INTRAMUSCULAR | Status: DC | PRN
  Administered 2015-04-21: 50 ug via INTRAVENOUS

## 2015-04-21 MED ORDER — CIPROFLOXACIN IN D5W 400 MG/200ML IV SOLN
400.0000 mg | INTRAVENOUS | Status: AC
  Administered 2015-04-21: 400 mg via INTRAVENOUS
  Filled 2015-04-21: qty 200

## 2015-04-21 SURGICAL SUPPLY — 23 items
ADAPTER CATH URET PLST 4-6FR (CATHETERS) IMPLANT
BAG DRAIN URO-CYSTO SKYTR STRL (DRAIN) ×2 IMPLANT
BAG DRN UROCATH (DRAIN) ×1
CATH INTERMIT  6FR 70CM (CATHETERS) ×2 IMPLANT
CATH URET 5FR 28IN CONE TIP (BALLOONS)
CATH URET 5FR 28IN OPEN ENDED (CATHETERS) IMPLANT
CATH URET 5FR 70CM CONE TIP (BALLOONS) IMPLANT
CLOTH BEACON ORANGE TIMEOUT ST (SAFETY) ×2 IMPLANT
GLOVE BIO SURGEON STRL SZ 6.5 (GLOVE) ×2 IMPLANT
GLOVE BIO SURGEON STRL SZ8 (GLOVE) ×2 IMPLANT
GLOVE INDICATOR 6.5 STRL GRN (GLOVE) ×2 IMPLANT
GOWN STRL REUS W/ TWL LRG LVL3 (GOWN DISPOSABLE) ×1 IMPLANT
GOWN STRL REUS W/ TWL XL LVL3 (GOWN DISPOSABLE) ×1 IMPLANT
GOWN STRL REUS W/TWL LRG LVL3 (GOWN DISPOSABLE) ×2
GOWN STRL REUS W/TWL XL LVL3 (GOWN DISPOSABLE) ×2
GUIDEWIRE 0.038 PTFE COATED (WIRE) IMPLANT
GUIDEWIRE ANG ZIPWIRE 038X150 (WIRE) IMPLANT
GUIDEWIRE STR DUAL SENSOR (WIRE) ×2 IMPLANT
KIT ROOM TURNOVER WOR (KITS) ×2 IMPLANT
MANIFOLD NEPTUNE II (INSTRUMENTS) ×2 IMPLANT
NS IRRIG 500ML POUR BTL (IV SOLUTION) ×2 IMPLANT
PACK CYSTO (CUSTOM PROCEDURE TRAY) ×2 IMPLANT
TUBE CONNECTING 12X1/4 (SUCTIONS) IMPLANT

## 2015-04-21 NOTE — Discharge Instructions (Signed)

## 2015-04-21 NOTE — Anesthesia Preprocedure Evaluation (Addendum)
Anesthesia Evaluation  Patient identified by MRN, date of birth, ID band Patient awake    Reviewed: Allergy & Precautions, NPO status , Patient's Chart, lab work & pertinent test results, reviewed documented beta blocker date and time   Airway Mallampati: II  TM Distance: >3 FB Neck ROM: Full    Dental  (+) Poor Dentition, Dental Advisory Given, Chipped 2 front teeth chipped:   Pulmonary sleep apnea , former smoker,  Mild OSA   Pulmonary exam normal breath sounds clear to auscultation       Cardiovascular hypertension, Pt. on medications and Pt. on home beta blockers Normal cardiovascular exam Rhythm:Regular Rate:Normal     Neuro/Psych negative neurological ROS  negative psych ROS   GI/Hepatic Neg liver ROS, GERD  ,  Endo/Other  negative endocrine ROS  Renal/GU negative Renal ROS  negative genitourinary   Musculoskeletal negative musculoskeletal ROS (+)   Abdominal (+) + obese,   Peds negative pediatric ROS (+)  Hematology negative hematology ROS (+)   Anesthesia Other Findings   Reproductive/Obstetrics negative OB ROS                           Anesthesia Physical Anesthesia Plan  ASA: III  Anesthesia Plan: General   Post-op Pain Management:    Induction: Intravenous  Airway Management Planned: LMA  Additional Equipment:   Intra-op Plan:   Post-operative Plan:   Informed Consent:   Plan Discussed with: Surgeon  Anesthesia Plan Comments:         Anesthesia Quick Evaluation

## 2015-04-21 NOTE — Transfer of Care (Signed)
  Immediate Anesthesia Transfer of Care Note  Patient: Donald [name]  Procedure(s) Performed: Procedure(s) (LRB): CYSTOSCOPY WITH BIOPSY, BILATERAL RETROGRADE PYELOGRAM (Bilateral)  Patient Location: PACU  Anesthesia Type: General  Level of Consciousness: awake, alert  and oriented  Airway & Oxygen Therapy: Patient Spontanous Breathing and Patient connected to face mask oxygen  Post-op Assessment: Report given to PACU RN and Post -op Vital signs reviewed and stable  Post vital signs: Reviewed and stable  Complications: No apparent anesthesia complications Last Vitals:  Filed Vitals:   04/21/15 1027  BP: 150/77  Pulse: 60  Temp: 36.4 C  Resp: 16
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
## 2015-04-21 NOTE — Op Note (Signed)
Preoperative diagnosis:history of bladder cancer with recent positive cytology  Postoperative diagnosis:same   Procedure:cystoscopy, bilateral retrograde ureteral pyelograms with intraoperative fluoroscopy, bilateral renal washings, bladder biopsy    Surgeon: Lillette Boxer. Carsynn Bethune, M.D.   Anesthesia: Gen.   Complications:none  Specimen(s):1.  Right renal washings for cytology 2.  Left renal washing for cytology 3.  Bladder biopsy  Drain(s):none  Indications:70 year old male with history of bladder cancer, with recent normal cystoscopy but positive cytology.  He presents at this time for cystoscopy, bilateral retrograde ureteropyelogram and bladder biopsy.    Technique and findings:the patient was properly identified in the holding area.  He received preoperative IV antibiotics.  He was taken the operating room where general anesthetic was administered with LMA.  He was placed in the dorsolithotomy position.     Genitalia perineum were prepped and draped.  Proper timeout was performed.  A 23 French panendoscope was advanced under direct vision through his urethra.  Prostate was moderately obstructive trilobar hypertrophy.  There was a median bar noted.  The bladder was inspected circumferentially both with the 30 and 70 lenses.  Several pale areas noted, specifically in the left trigonal area where prior TURP was performed.  Around this area there was some mild erythematous urothelium-no papillary configuration noted.  There were no other bladder lesions or abnormalities.  There was minimal trabeculations.  No foreign bodies were noted.  Ureteral orifices were somewhat obscured by the large median lobe.  The right ureteral orifice was cannulated with a 6 [place] open-ended catheter.  The catheter was advanced into the right renal pelvic area using fluoroscopic guidance.  At this point, saline was used to obtain a renal washing from the right renal pelvis.  This was sent for cytology labeled  "right renal washing".  The catheter was backed down to the right UVJ where, using Omnipaque, a right retrograde ureteral pyelogram was performed.  This revealed a normal ureter, and a small right renal pelvis without filling defects.  Calyces were normal as well.  A similar procedure was performed following the right retrograde ureteropyelogram, or minimal left side.  Again, a renal washing was taken after advancing the catheter up into the left renal pelvic area.  Saline was used for the renal washing, and it was sent for cytology labeled "left renal washing".  The catheter was then backed down to the left ureterovesical junction.  At this point, contrast was again used for retrograde pyelogram.  This revealed a normal left ureter, without filling defects/stricture/hydronephrosis, as well as a normal pyelocalyceal system.  Following completion of the retrogrades, the cystoscope was used to obtain biopsies of the small erythematous areas around the prior TUR site.  All erythematous tissue was removed.  The biopsies were labeled "bladder biopsy".  It were sent for permanent specimen.  Proper biopsy timeouts were performed.  I then used the Bugbee electrode to cauterize all biopsy sites.  There being no further abnormalities or procedure that needed to be performed, the scope was removed following drainage of the bladder.  The patient was then awakened and taken to the PACU in stable condition.

## 2015-04-21 NOTE — Anesthesia Postprocedure Evaluation (Signed)
Anesthesia Post Note  Patient: Donald [name]  Procedure(s) Performed: Procedure(s) (LRB): CYSTOSCOPY WITH BIOPSY, BILATERAL RETROGRADE PYELOGRAM (Bilateral)  Patient location during evaluation: PACU Anesthesia Type: General Level of consciousness: awake and alert Pain management: pain level controlled Vital Signs Assessment: post-procedure vital signs reviewed and stable Respiratory status: spontaneous breathing, nonlabored ventilation, respiratory function stable and patient connected to nasal cannula oxygen Cardiovascular status: blood pressure returned to baseline and stable Postop Assessment: no signs of nausea or vomiting Anesthetic complications: no    Last Vitals:  Filed Vitals:   04/21/15 1315 04/21/15 1330  BP: 150/76 126/72  Pulse: 71 68  Temp:    Resp: 19 16    Last Pain: There were no vitals filed for this visit.               Torrey Horseman,JAMES TERRILL

## 2015-04-21 NOTE — Anesthesia Procedure Notes (Signed)
Procedure Name: LMA Insertion Date/Time: 04/21/2015 12:14 PM Performed by: Mechele Claude Pre-anesthesia Checklist: Patient identified, Emergency Drugs available, Suction available and Patient being monitored Patient Re-evaluated:Patient Re-evaluated prior to inductionOxygen Delivery Method: Circle System Utilized Preoxygenation: Pre-oxygenation with 100% oxygen Intubation Type: IV induction Ventilation: Mask ventilation without difficulty LMA: LMA inserted LMA Size: 5.0 Number of attempts: 1 Airway Equipment and Method: bite block Placement Confirmation: positive ETCO2 Tube secured with: Tape Dental Injury: Teeth and Oropharynx as per pre-operative assessment

## 2015-04-24 ENCOUNTER — Encounter (HOSPITAL_BASED_OUTPATIENT_CLINIC_OR_DEPARTMENT_OTHER): Payer: Self-pay | Admitting: Urology

## 2015-05-02 NOTE — Anesthesia Postprocedure Evaluation (Signed)
Anesthesia Post Note  Patient: Donald [name]  Procedure(s) Performed: Procedure(s) (LRB): CYSTOSCOPY WITH BIOPSY, BILATERAL RETROGRADE PYELOGRAM (Bilateral)  Patient location during evaluation: PACU Anesthesia Type: General Level of consciousness: awake and alert Pain management: pain level controlled Vital Signs Assessment: post-procedure vital signs reviewed and stable Respiratory status: spontaneous breathing, nonlabored ventilation, respiratory function stable and patient connected to nasal cannula oxygen Cardiovascular status: blood pressure returned to baseline and stable Postop Assessment: no signs of nausea or vomiting Anesthetic complications: no    Last Vitals:  Filed Vitals:   04/21/15 1330 04/21/15 1506  BP: 126/72 164/74  Pulse: 68 64  Temp:  36.5 C  Resp: 16 18    Last Pain: There were no vitals filed for this visit.               Trea Latner,JAMES TERRILL

## 2015-05-19 DIAGNOSIS — R8271 Bacteriuria: Secondary | ICD-10-CM | POA: Diagnosis not present

## 2015-05-19 DIAGNOSIS — D090 Carcinoma in situ of bladder: Secondary | ICD-10-CM | POA: Diagnosis not present

## 2015-05-19 DIAGNOSIS — C670 Malignant neoplasm of trigone of bladder: Secondary | ICD-10-CM | POA: Diagnosis not present

## 2015-05-19 DIAGNOSIS — Z0000 Encounter for general adult medical examination without abnormal findings: Secondary | ICD-10-CM | POA: Diagnosis not present

## 2015-05-19 DIAGNOSIS — C679 Malignant neoplasm of bladder, unspecified: Secondary | ICD-10-CM | POA: Diagnosis not present

## 2015-05-26 DIAGNOSIS — C670 Malignant neoplasm of trigone of bladder: Secondary | ICD-10-CM | POA: Diagnosis not present

## 2015-05-26 DIAGNOSIS — Z5111 Encounter for antineoplastic chemotherapy: Secondary | ICD-10-CM | POA: Diagnosis not present

## 2015-06-02 DIAGNOSIS — C670 Malignant neoplasm of trigone of bladder: Secondary | ICD-10-CM | POA: Diagnosis not present

## 2015-06-02 DIAGNOSIS — Z5111 Encounter for antineoplastic chemotherapy: Secondary | ICD-10-CM | POA: Diagnosis not present

## 2015-06-09 DIAGNOSIS — Z5111 Encounter for antineoplastic chemotherapy: Secondary | ICD-10-CM | POA: Diagnosis not present

## 2015-06-09 DIAGNOSIS — C670 Malignant neoplasm of trigone of bladder: Secondary | ICD-10-CM | POA: Diagnosis not present

## 2015-06-23 DIAGNOSIS — C670 Malignant neoplasm of trigone of bladder: Secondary | ICD-10-CM | POA: Diagnosis not present

## 2015-06-23 DIAGNOSIS — Z5111 Encounter for antineoplastic chemotherapy: Secondary | ICD-10-CM | POA: Diagnosis not present

## 2015-06-23 DIAGNOSIS — D090 Carcinoma in situ of bladder: Secondary | ICD-10-CM | POA: Diagnosis not present

## 2015-06-28 DIAGNOSIS — M109 Gout, unspecified: Secondary | ICD-10-CM | POA: Diagnosis not present

## 2015-06-28 DIAGNOSIS — E669 Obesity, unspecified: Secondary | ICD-10-CM | POA: Diagnosis not present

## 2015-06-28 DIAGNOSIS — N529 Male erectile dysfunction, unspecified: Secondary | ICD-10-CM | POA: Diagnosis not present

## 2015-06-28 DIAGNOSIS — I10 Essential (primary) hypertension: Secondary | ICD-10-CM | POA: Diagnosis not present

## 2015-06-28 DIAGNOSIS — R7309 Other abnormal glucose: Secondary | ICD-10-CM | POA: Diagnosis not present

## 2015-06-28 DIAGNOSIS — R7301 Impaired fasting glucose: Secondary | ICD-10-CM | POA: Diagnosis not present

## 2015-06-28 DIAGNOSIS — E785 Hyperlipidemia, unspecified: Secondary | ICD-10-CM | POA: Diagnosis not present

## 2015-06-30 DIAGNOSIS — C670 Malignant neoplasm of trigone of bladder: Secondary | ICD-10-CM | POA: Diagnosis not present

## 2015-06-30 DIAGNOSIS — Z5111 Encounter for antineoplastic chemotherapy: Secondary | ICD-10-CM | POA: Diagnosis not present

## 2015-06-30 DIAGNOSIS — D090 Carcinoma in situ of bladder: Secondary | ICD-10-CM | POA: Diagnosis not present

## 2015-08-27 DIAGNOSIS — J029 Acute pharyngitis, unspecified: Secondary | ICD-10-CM | POA: Diagnosis not present

## 2015-08-27 DIAGNOSIS — J069 Acute upper respiratory infection, unspecified: Secondary | ICD-10-CM | POA: Diagnosis not present

## 2015-09-07 DIAGNOSIS — R030 Elevated blood-pressure reading, without diagnosis of hypertension: Secondary | ICD-10-CM | POA: Diagnosis not present

## 2015-09-07 DIAGNOSIS — L03317 Cellulitis of buttock: Secondary | ICD-10-CM | POA: Diagnosis not present

## 2015-10-04 DIAGNOSIS — C670 Malignant neoplasm of trigone of bladder: Secondary | ICD-10-CM | POA: Diagnosis not present

## 2015-10-24 DIAGNOSIS — C670 Malignant neoplasm of trigone of bladder: Secondary | ICD-10-CM | POA: Diagnosis not present

## 2015-10-24 DIAGNOSIS — Z5111 Encounter for antineoplastic chemotherapy: Secondary | ICD-10-CM | POA: Diagnosis not present

## 2015-10-28 DIAGNOSIS — S61213A Laceration without foreign body of left middle finger without damage to nail, initial encounter: Secondary | ICD-10-CM | POA: Diagnosis not present

## 2015-10-31 DIAGNOSIS — C670 Malignant neoplasm of trigone of bladder: Secondary | ICD-10-CM | POA: Diagnosis not present

## 2015-10-31 DIAGNOSIS — Z5111 Encounter for antineoplastic chemotherapy: Secondary | ICD-10-CM | POA: Diagnosis not present

## 2015-11-03 DIAGNOSIS — H5202 Hypermetropia, left eye: Secondary | ICD-10-CM | POA: Diagnosis not present

## 2015-11-07 DIAGNOSIS — C670 Malignant neoplasm of trigone of bladder: Secondary | ICD-10-CM | POA: Diagnosis not present

## 2015-11-07 DIAGNOSIS — Z5111 Encounter for antineoplastic chemotherapy: Secondary | ICD-10-CM | POA: Diagnosis not present

## 2015-11-14 DIAGNOSIS — C670 Malignant neoplasm of trigone of bladder: Secondary | ICD-10-CM | POA: Diagnosis not present

## 2015-11-21 DIAGNOSIS — Z5111 Encounter for antineoplastic chemotherapy: Secondary | ICD-10-CM | POA: Diagnosis not present

## 2015-11-21 DIAGNOSIS — C670 Malignant neoplasm of trigone of bladder: Secondary | ICD-10-CM | POA: Diagnosis not present

## 2015-11-28 DIAGNOSIS — C670 Malignant neoplasm of trigone of bladder: Secondary | ICD-10-CM | POA: Diagnosis not present

## 2015-11-28 DIAGNOSIS — Z5111 Encounter for antineoplastic chemotherapy: Secondary | ICD-10-CM | POA: Diagnosis not present

## 2016-01-01 DIAGNOSIS — Z23 Encounter for immunization: Secondary | ICD-10-CM | POA: Diagnosis not present

## 2016-01-01 DIAGNOSIS — R202 Paresthesia of skin: Secondary | ICD-10-CM | POA: Diagnosis not present

## 2016-01-01 DIAGNOSIS — Z1159 Encounter for screening for other viral diseases: Secondary | ICD-10-CM | POA: Diagnosis not present

## 2016-01-01 DIAGNOSIS — E785 Hyperlipidemia, unspecified: Secondary | ICD-10-CM | POA: Diagnosis not present

## 2016-01-01 DIAGNOSIS — R7303 Prediabetes: Secondary | ICD-10-CM | POA: Diagnosis not present

## 2016-01-01 DIAGNOSIS — R413 Other amnesia: Secondary | ICD-10-CM | POA: Diagnosis not present

## 2016-01-01 DIAGNOSIS — M109 Gout, unspecified: Secondary | ICD-10-CM | POA: Diagnosis not present

## 2016-01-01 DIAGNOSIS — Z125 Encounter for screening for malignant neoplasm of prostate: Secondary | ICD-10-CM | POA: Diagnosis not present

## 2016-01-01 DIAGNOSIS — E669 Obesity, unspecified: Secondary | ICD-10-CM | POA: Diagnosis not present

## 2016-01-01 DIAGNOSIS — C670 Malignant neoplasm of trigone of bladder: Secondary | ICD-10-CM | POA: Diagnosis not present

## 2016-01-01 DIAGNOSIS — Z0001 Encounter for general adult medical examination with abnormal findings: Secondary | ICD-10-CM | POA: Diagnosis not present

## 2016-01-01 DIAGNOSIS — I10 Essential (primary) hypertension: Secondary | ICD-10-CM | POA: Diagnosis not present

## 2016-01-11 DIAGNOSIS — D225 Melanocytic nevi of trunk: Secondary | ICD-10-CM | POA: Diagnosis not present

## 2016-01-11 DIAGNOSIS — L821 Other seborrheic keratosis: Secondary | ICD-10-CM | POA: Diagnosis not present

## 2016-01-11 DIAGNOSIS — L720 Epidermal cyst: Secondary | ICD-10-CM | POA: Diagnosis not present

## 2016-01-11 DIAGNOSIS — L814 Other melanin hyperpigmentation: Secondary | ICD-10-CM | POA: Diagnosis not present

## 2016-01-16 ENCOUNTER — Ambulatory Visit: Payer: Medicare Other | Admitting: Neurology

## 2016-01-24 ENCOUNTER — Ambulatory Visit (INDEPENDENT_AMBULATORY_CARE_PROVIDER_SITE_OTHER): Payer: Medicare Other | Admitting: Neurology

## 2016-01-24 ENCOUNTER — Encounter: Payer: Self-pay | Admitting: Neurology

## 2016-01-24 VITALS — BP 152/82 | HR 67 | Ht 67.0 in | Wt 222.0 lb

## 2016-01-24 DIAGNOSIS — E6609 Other obesity due to excess calories: Secondary | ICD-10-CM | POA: Diagnosis not present

## 2016-01-24 DIAGNOSIS — G3184 Mild cognitive impairment, so stated: Secondary | ICD-10-CM

## 2016-01-24 DIAGNOSIS — G4733 Obstructive sleep apnea (adult) (pediatric): Secondary | ICD-10-CM | POA: Diagnosis not present

## 2016-01-24 DIAGNOSIS — R202 Paresthesia of skin: Secondary | ICD-10-CM

## 2016-01-24 DIAGNOSIS — E669 Obesity, unspecified: Secondary | ICD-10-CM | POA: Insufficient documentation

## 2016-01-24 DIAGNOSIS — G4730 Sleep apnea, unspecified: Secondary | ICD-10-CM | POA: Insufficient documentation
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
## 2016-02-14 ENCOUNTER — Telehealth: Payer: Self-pay | Admitting: *Deleted

## 2016-02-14 NOTE — Telephone Encounter (Signed)
Labs received from [place] at [facility] (collected on 01/01/16):  TSH: 1.62  CBC w/ DIFF: EOS% - 13.8 EOS# - 0.7 (all other values wnl)  SED RATE: 13 wnl  FOLATE, SERUM: >20.0 wnl  VITAMIN B12: 529 wnl  HGB A1C: 5.5 wnl  ESTIMATED AVG GLUCOSE: 111  LIPID PANEL: Cholesterol - 260 Calc LDL - 160 Non-HDL - 195 (all other values wnl)  CMP: Glucose - 99 wnl BUN - 19 wnl Creatinine - 1.15 wnl Calcium - 10.7 Albumin - 4.9 (all other values wnl)  URIC ACID: 6.5 wnl  PSA: 3.62 wnl

## 2016-02-22 DIAGNOSIS — R413 Other amnesia: Secondary | ICD-10-CM | POA: Diagnosis not present

## 2016-02-22 DIAGNOSIS — R7303 Prediabetes: Secondary | ICD-10-CM | POA: Diagnosis not present

## 2016-02-22 DIAGNOSIS — I8393 Asymptomatic varicose veins of bilateral lower extremities: Secondary | ICD-10-CM | POA: Diagnosis not present

## 2016-02-22 DIAGNOSIS — E669 Obesity, unspecified: Secondary | ICD-10-CM | POA: Diagnosis not present

## 2016-02-22 DIAGNOSIS — E785 Hyperlipidemia, unspecified: Secondary | ICD-10-CM | POA: Diagnosis not present

## 2016-02-22 DIAGNOSIS — C670 Malignant neoplasm of trigone of bladder: Secondary | ICD-10-CM | POA: Diagnosis not present

## 2016-02-22 DIAGNOSIS — R202 Paresthesia of skin: Secondary | ICD-10-CM | POA: Diagnosis not present

## 2016-02-22 DIAGNOSIS — M109 Gout, unspecified: Secondary | ICD-10-CM | POA: Diagnosis not present

## 2016-02-22 DIAGNOSIS — N529 Male erectile dysfunction, unspecified: Secondary | ICD-10-CM | POA: Diagnosis not present

## 2016-02-22 DIAGNOSIS — I10 Essential (primary) hypertension: Secondary | ICD-10-CM | POA: Diagnosis not present

## 2016-03-04 DIAGNOSIS — C678 Malignant neoplasm of overlapping sites of bladder: Secondary | ICD-10-CM | POA: Diagnosis not present

## 2016-03-22 ENCOUNTER — Encounter (INDEPENDENT_AMBULATORY_CARE_PROVIDER_SITE_OTHER): Payer: Self-pay

## 2016-03-22 ENCOUNTER — Ambulatory Visit (INDEPENDENT_AMBULATORY_CARE_PROVIDER_SITE_OTHER): Payer: Medicare Other | Admitting: Neurology

## 2016-03-22 DIAGNOSIS — E6609 Other obesity due to excess calories: Secondary | ICD-10-CM

## 2016-03-22 DIAGNOSIS — Z0289 Encounter for other administrative examinations: Secondary | ICD-10-CM

## 2016-03-22 DIAGNOSIS — R202 Paresthesia of skin: Secondary | ICD-10-CM | POA: Diagnosis not present

## 2016-03-22 DIAGNOSIS — G3184 Mild cognitive impairment, so stated: Secondary | ICD-10-CM

## 2016-03-22 DIAGNOSIS — G4733 Obstructive sleep apnea (adult) (pediatric): Secondary | ICD-10-CM

## 2016-03-22 NOTE — Procedures (Signed)
Full Name: Donald [name] Gender: Male MRN #: [id number] Date of Birth: 1946-02-15    Visit Date: 03/22/2016 08:26 Age: 70 Years 0 Months Old Examining Physician: Marcial Pacas, MD  Referring Physician: Krista Blue History:  70 years old male, presented with many years history of intermittent bilateral fingertips paresthesia, mild grip weakness  Summary of the test: Nerve conduction study: Bilateral median sensory responses were absent. Bilateral median mixed response showed severely prolonged peak latency, with decreased snap amplitude. Bilateral median motor responses showed severely prolonged distal latency, right worse than left.  Bilateral ulnar motor responses were normal.  Electromyography: Selected needle examination of right upper extremity muscles, and right cervical paraspinal muscles, bilateral abductor pollicis brevis was performed. There is evidence of mild active neuropathic changes involving bilateral abductor pollicis brevis, there was evidence of denervation.  Conclusion: This is an abnormal study. There is electrodiagnostic evidence of bilateral median neuropathy across the wrist, consistent with severe bilateral carpal tunnel syndromes. There is no evidence of right cervical radiculopathy.   ------------------------------- Marcial Pacas, M.D.  [facility] [place], [place] [address] Tel: [phone] Fax: [phone]        [facility]    Nerve / Sites Rec. Site Peak Lat Ref. Amp.1-2 Ref. Distance    ms ms V V cm  R Median, Ulnar - Transcarpal comparison     Median Palm Wrist 6.15 ?2.20 10.7 ?40.0 8     Ulnar Palm Wrist 1.98 ?2.20 11.3 ?12.0 8          L Median, Ulnar - Transcarpal comparison     Median Palm Wrist 6.46 ?2.20 8.5 ?40.0 8     Ulnar Palm Wrist 2.08 ?2.20 24.6 ?12.0 8          R Median - Orthodromic (Dig II, Mid palm)     Dig II Wrist NR ?3.40 NR ?10.0 13  L Median - Orthodromic (Dig II, Mid palm)     Dig II Wrist NR  ?3.40 NR ?10.0 13  R Ulnar - Orthodromic, (Dig V, Mid palm)     Dig V Wrist 2.71 ?3.10 5.5 ?5.0 11  L Ulnar - Orthodromic, (Dig V, Mid palm)     Dig V Wrist 2.86 ?3.10 7.5 ?5.0 11     MNC    Nerve / Sites Muscle Latency Ref. Amplitude Ref. Rel Amp Segments Distance Lat Diff Velocity Ref. Area    ms ms mV mV %  cm ms m/s m/s mVms  R Median - APB     Wrist APB 10.6 ?4.4 1.2 ?4.0 100 Wrist - APB 7    4.0     Upper arm APB 16.1  0.8  68 Upper arm - Wrist 22 5.5 40  2.9  L Median - APB     Wrist APB 5.3 ?4.4 1.5 ?4.0 100 Wrist - APB 7    3.2     Upper arm APB 10.2  0.7  45.8 Upper arm - Wrist 23 4.9 47  1.6  R Ulnar - ADM     Wrist ADM 3.3 ?3.3 13.7 ?6.0 100 Wrist - ADM 7    39.7     B.Elbow ADM 6.4  13.2  95.8 B.Elbow - Wrist 18 3.1 58 ?49 38.6     A.Elbow ADM 8.4  12.7  96.2 A.Elbow - B.Elbow 11 2.0 54 ?49 39.1         A.Elbow - Wrist  5.2  L Ulnar - ADM     Wrist ADM 3.2 ?3.3 12.4 ?6.0 100 Wrist - ADM 7    29.4     B.Elbow ADM 6.8  11.6  93.8 B.Elbow - Wrist 18 3.6 50 ?49 30.8     A.Elbow ADM 9.2  10.8  93 A.Elbow - B.Elbow 12 2.4 49 ?49 30.8         A.Elbow - Wrist  6.0        F  Wave    Nerve F Lat Ref.   ms ms  R Median - APB 26.0 ?31.0  R Ulnar - ADM 30.8 ?32.0  L Median - APB 32.7 ?31.0  L Ulnar - ADM 30.1 ?32.0     EMG full       EMG Summary Table    Spontaneous MUAP Recruitment  Muscle IA Fib PSW Fasc Other Amp Dur. Poly Pattern  R. Pronator teres Normal None None None _______ Normal Normal Normal Normal  R. Extensor digitorum communis Normal None None None _______ Normal Normal Normal Normal  R. Deltoid Normal None None None _______ Normal Normal Normal Normal  R. Biceps brachii Normal None None None _______ Normal Normal Normal Normal  R. Triceps brachii Normal None None None _______ Normal Normal Normal Normal  R. Abductor pollicis brevis Normal 1+ None None _______ Increased Increased Normal Reduced  L. Abductor pollicis brevis Normal 1+ None None _______  Increased Increased 1+ Reduced  R. Cervical paraspinals Normal None None None _______ Normal Normal Normal Normal

## 2016-04-02 DIAGNOSIS — Z5111 Encounter for antineoplastic chemotherapy: Secondary | ICD-10-CM | POA: Diagnosis not present

## 2016-04-02 DIAGNOSIS — C678 Malignant neoplasm of overlapping sites of bladder: Secondary | ICD-10-CM | POA: Diagnosis not present

## 2016-04-10 DIAGNOSIS — Z5111 Encounter for antineoplastic chemotherapy: Secondary | ICD-10-CM | POA: Diagnosis not present

## 2016-04-10 DIAGNOSIS — C678 Malignant neoplasm of overlapping sites of bladder: Secondary | ICD-10-CM | POA: Diagnosis not present

## 2016-04-16 DIAGNOSIS — Z5111 Encounter for antineoplastic chemotherapy: Secondary | ICD-10-CM | POA: Diagnosis not present

## 2016-04-16 DIAGNOSIS — C678 Malignant neoplasm of overlapping sites of bladder: Secondary | ICD-10-CM | POA: Diagnosis not present

## 2016-04-17 DIAGNOSIS — H1031 Unspecified acute conjunctivitis, right eye: Secondary | ICD-10-CM | POA: Diagnosis not present

## 2016-04-17 DIAGNOSIS — J069 Acute upper respiratory infection, unspecified: Secondary | ICD-10-CM | POA: Diagnosis not present

## 2016-06-26 ENCOUNTER — Ambulatory Visit
Admission: RE | Admit: 2016-06-26 | Discharge: 2016-06-26 | Disposition: A | Discharge status: Home / Self Care | Payer: Medicare Other | Source: Ambulatory Visit | Attending: Neurology | Admitting: Neurology

## 2016-06-26 ENCOUNTER — Other Ambulatory Visit: Payer: Self-pay | Admitting: Neurology

## 2016-06-26 DIAGNOSIS — E6609 Other obesity due to excess calories: Secondary | ICD-10-CM

## 2016-06-26 DIAGNOSIS — R202 Paresthesia of skin: Secondary | ICD-10-CM

## 2016-06-26 DIAGNOSIS — G3184 Mild cognitive impairment, so stated: Secondary | ICD-10-CM | POA: Diagnosis not present

## 2016-06-26 DIAGNOSIS — M795 Residual foreign body in soft tissue: Secondary | ICD-10-CM

## 2016-06-26 DIAGNOSIS — Z0389 Encounter for observation for other suspected diseases and conditions ruled out: Secondary | ICD-10-CM | POA: Diagnosis not present

## 2016-06-26 DIAGNOSIS — G4733 Obstructive sleep apnea (adult) (pediatric): Secondary | ICD-10-CM

## 2016-06-27 ENCOUNTER — Telehealth: Payer: Self-pay | Admitting: Neurology

## 2016-06-27 DIAGNOSIS — R7309 Other abnormal glucose: Secondary | ICD-10-CM | POA: Diagnosis not present

## 2016-06-27 DIAGNOSIS — E785 Hyperlipidemia, unspecified: Secondary | ICD-10-CM | POA: Diagnosis not present

## 2016-06-27 DIAGNOSIS — I10 Essential (primary) hypertension: Secondary | ICD-10-CM | POA: Diagnosis not present

## 2016-06-27 DIAGNOSIS — N529 Male erectile dysfunction, unspecified: Secondary | ICD-10-CM | POA: Diagnosis not present

## 2016-06-27 NOTE — Telephone Encounter (Signed)
Please call patient, MRI of the brain showed chronic changes, enlarged ventricle, mild atrophy, small vessel disease. There was no acute lesions.  I will review MRI findings with him in detail at next follow-up visit on July 10 2016.

## 2016-06-27 NOTE — Telephone Encounter (Signed)
Spoke to patient and his wife - they are aware of results and will keep his follow up for further discussion.

## 2016-06-28 DIAGNOSIS — N529 Male erectile dysfunction, unspecified: Secondary | ICD-10-CM | POA: Diagnosis not present

## 2016-06-28 DIAGNOSIS — I10 Essential (primary) hypertension: Secondary | ICD-10-CM | POA: Diagnosis not present

## 2016-06-28 DIAGNOSIS — R7303 Prediabetes: Secondary | ICD-10-CM | POA: Diagnosis not present

## 2016-06-28 DIAGNOSIS — E785 Hyperlipidemia, unspecified: Secondary | ICD-10-CM | POA: Diagnosis not present

## 2016-07-01 DIAGNOSIS — C678 Malignant neoplasm of overlapping sites of bladder: Secondary | ICD-10-CM | POA: Diagnosis not present

## 2016-07-01 DIAGNOSIS — N99111 Postprocedural bulbous urethral stricture: Secondary | ICD-10-CM | POA: Diagnosis not present

## 2016-07-10 ENCOUNTER — Encounter: Payer: Self-pay | Admitting: Neurology

## 2016-07-10 ENCOUNTER — Ambulatory Visit (INDEPENDENT_AMBULATORY_CARE_PROVIDER_SITE_OTHER): Payer: Medicare Other | Admitting: Neurology

## 2016-07-10 VITALS — BP 126/82 | HR 68 | Ht 67.0 in | Wt 219.0 lb

## 2016-07-10 DIAGNOSIS — G5603 Carpal tunnel syndrome, bilateral upper limbs: Secondary | ICD-10-CM | POA: Diagnosis not present

## 2016-07-10 DIAGNOSIS — G3184 Mild cognitive impairment, so stated: Secondary | ICD-10-CM | POA: Diagnosis not present

## 2016-07-10 MED ORDER — DONEPEZIL HCL 10 MG PO TABS: 10.0000 mg | ORAL_TABLET | Freq: Every day | ORAL | 11 refills | Status: AC

## 2016-07-10 MED ORDER — MEMANTINE HCL 10 MG PO TABS: 10.0000 mg | ORAL_TABLET | Freq: Two times a day (BID) | ORAL | 11 refills | Status: AC
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
## 2016-09-18 ENCOUNTER — Other Ambulatory Visit: Payer: Self-pay | Admitting: *Deleted

## 2016-09-18 ENCOUNTER — Telehealth: Payer: Self-pay | Admitting: Neurology

## 2016-09-18 DIAGNOSIS — G5603 Carpal tunnel syndrome, bilateral upper limbs: Secondary | ICD-10-CM

## 2016-09-18 NOTE — Telephone Encounter (Signed)
Ok, per vo by Dr. Krista Blue, to place referral for Dr. Fredna Dow.  Patient aware that this has been taken care of for him.

## 2016-09-18 NOTE — Telephone Encounter (Signed)
Patients wife called office to see if a referral can be placed for Dr. Fredna Dow (hand specialist) to move forward with Carpal tunnel surgery.  Please call

## 2016-09-30 DIAGNOSIS — R52 Pain, unspecified: Secondary | ICD-10-CM | POA: Diagnosis not present

## 2016-09-30 DIAGNOSIS — M180 Bilateral primary osteoarthritis of first carpometacarpal joints: Secondary | ICD-10-CM | POA: Diagnosis not present

## 2016-09-30 DIAGNOSIS — M79641 Pain in right hand: Secondary | ICD-10-CM | POA: Diagnosis not present

## 2016-09-30 DIAGNOSIS — G5603 Carpal tunnel syndrome, bilateral upper limbs: Secondary | ICD-10-CM | POA: Diagnosis not present

## 2016-09-30 DIAGNOSIS — M79642 Pain in left hand: Secondary | ICD-10-CM | POA: Diagnosis not present

## 2016-10-28 ENCOUNTER — Other Ambulatory Visit: Payer: Self-pay | Admitting: Orthopedic Surgery

## 2016-11-05 DIAGNOSIS — H5212 Myopia, left eye: Secondary | ICD-10-CM | POA: Diagnosis not present

## 2017-01-08 DIAGNOSIS — C678 Malignant neoplasm of overlapping sites of bladder: Secondary | ICD-10-CM | POA: Diagnosis not present

## 2017-01-09 ENCOUNTER — Encounter (HOSPITAL_BASED_OUTPATIENT_CLINIC_OR_DEPARTMENT_OTHER): Payer: Self-pay | Admitting: *Deleted

## 2017-01-10 DIAGNOSIS — Z0000 Encounter for general adult medical examination without abnormal findings: Secondary | ICD-10-CM | POA: Diagnosis not present

## 2017-01-10 DIAGNOSIS — I10 Essential (primary) hypertension: Secondary | ICD-10-CM | POA: Diagnosis not present

## 2017-01-10 DIAGNOSIS — N529 Male erectile dysfunction, unspecified: Secondary | ICD-10-CM | POA: Diagnosis not present

## 2017-01-10 DIAGNOSIS — Z23 Encounter for immunization: Secondary | ICD-10-CM | POA: Diagnosis not present

## 2017-01-13 ENCOUNTER — Other Ambulatory Visit: Payer: Self-pay

## 2017-01-13 ENCOUNTER — Encounter (HOSPITAL_BASED_OUTPATIENT_CLINIC_OR_DEPARTMENT_OTHER)
Admission: RE | Admit: 2017-01-13 | Discharge: 2017-01-13 | Disposition: A | Discharge status: Home / Self Care | Payer: Medicare Other | Source: Ambulatory Visit | Attending: Orthopedic Surgery | Admitting: Orthopedic Surgery

## 2017-01-13 DIAGNOSIS — Z880 Allergy status to penicillin: Secondary | ICD-10-CM | POA: Diagnosis not present

## 2017-01-13 DIAGNOSIS — I10 Essential (primary) hypertension: Secondary | ICD-10-CM | POA: Diagnosis not present

## 2017-01-13 DIAGNOSIS — G5603 Carpal tunnel syndrome, bilateral upper limbs: Secondary | ICD-10-CM | POA: Diagnosis not present

## 2017-01-13 DIAGNOSIS — E669 Obesity, unspecified: Secondary | ICD-10-CM | POA: Diagnosis not present

## 2017-01-13 DIAGNOSIS — Z8551 Personal history of malignant neoplasm of bladder: Secondary | ICD-10-CM | POA: Diagnosis not present

## 2017-01-13 DIAGNOSIS — Z6833 Body mass index (BMI) 33.0-33.9, adult: Secondary | ICD-10-CM | POA: Diagnosis not present

## 2017-01-13 DIAGNOSIS — M109 Gout, unspecified: Secondary | ICD-10-CM | POA: Diagnosis not present

## 2017-01-13 DIAGNOSIS — E785 Hyperlipidemia, unspecified: Secondary | ICD-10-CM | POA: Diagnosis not present

## 2017-01-13 DIAGNOSIS — Z87891 Personal history of nicotine dependence: Secondary | ICD-10-CM | POA: Diagnosis not present

## 2017-01-13 DIAGNOSIS — K219 Gastro-esophageal reflux disease without esophagitis: Secondary | ICD-10-CM | POA: Diagnosis not present

## 2017-01-13 DIAGNOSIS — G4733 Obstructive sleep apnea (adult) (pediatric): Secondary | ICD-10-CM | POA: Diagnosis not present

## 2017-01-13 LAB — BASIC METABOLIC PANEL
ANION GAP: 10 (ref 5–15)
BUN: 18 mg/dL (ref 6–20)
CHLORIDE: 97 mmol/L — AB (ref 101–111)
CO2: 28 mmol/L (ref 22–32)
Calcium: 9.9 mg/dL (ref 8.9–10.3)
Creatinine, Ser: 1.12 mg/dL (ref 0.61–1.24)
GFR calc Af Amer: >60 mL/min (ref >60)
GFR calc non Af Amer: >60 mL/min (ref >60)
GLUCOSE: 94 mg/dL (ref 65–99)
POTASSIUM: 3.9 mmol/L (ref 3.5–5.1)
Sodium: 135 mmol/L (ref 135–145)

## 2017-01-16 ENCOUNTER — Encounter (HOSPITAL_BASED_OUTPATIENT_CLINIC_OR_DEPARTMENT_OTHER): Payer: Self-pay | Admitting: Certified Registered Nurse Anesthetist

## 2017-01-16 ENCOUNTER — Encounter (HOSPITAL_BASED_OUTPATIENT_CLINIC_OR_DEPARTMENT_OTHER)
Admission: RE | Disposition: A | Discharge status: Home / Self Care | Payer: Self-pay | Source: Ambulatory Visit | Attending: Orthopedic Surgery

## 2017-01-16 ENCOUNTER — Ambulatory Visit (HOSPITAL_BASED_OUTPATIENT_CLINIC_OR_DEPARTMENT_OTHER): Payer: Medicare Other | Admitting: Certified Registered Nurse Anesthetist

## 2017-01-16 ENCOUNTER — Ambulatory Visit (HOSPITAL_BASED_OUTPATIENT_CLINIC_OR_DEPARTMENT_OTHER)
Admission: RE | Admit: 2017-01-16 | Discharge: 2017-01-16 | Disposition: A | Discharge status: Home / Self Care | Payer: Medicare Other | Source: Ambulatory Visit | Attending: Orthopedic Surgery | Admitting: Orthopedic Surgery

## 2017-01-16 DIAGNOSIS — E669 Obesity, unspecified: Secondary | ICD-10-CM | POA: Insufficient documentation

## 2017-01-16 DIAGNOSIS — G5603 Carpal tunnel syndrome, bilateral upper limbs: Secondary | ICD-10-CM | POA: Insufficient documentation

## 2017-01-16 DIAGNOSIS — Z880 Allergy status to penicillin: Secondary | ICD-10-CM | POA: Insufficient documentation

## 2017-01-16 DIAGNOSIS — K219 Gastro-esophageal reflux disease without esophagitis: Secondary | ICD-10-CM | POA: Insufficient documentation

## 2017-01-16 DIAGNOSIS — G4733 Obstructive sleep apnea (adult) (pediatric): Secondary | ICD-10-CM | POA: Insufficient documentation

## 2017-01-16 DIAGNOSIS — Z6833 Body mass index (BMI) 33.0-33.9, adult: Secondary | ICD-10-CM | POA: Insufficient documentation

## 2017-01-16 DIAGNOSIS — E785 Hyperlipidemia, unspecified: Secondary | ICD-10-CM | POA: Diagnosis not present

## 2017-01-16 DIAGNOSIS — R202 Paresthesia of skin: Secondary | ICD-10-CM | POA: Diagnosis not present

## 2017-01-16 DIAGNOSIS — Z8551 Personal history of malignant neoplasm of bladder: Secondary | ICD-10-CM | POA: Insufficient documentation

## 2017-01-16 DIAGNOSIS — M109 Gout, unspecified: Secondary | ICD-10-CM | POA: Diagnosis not present

## 2017-01-16 DIAGNOSIS — G5601 Carpal tunnel syndrome, right upper limb: Secondary | ICD-10-CM | POA: Diagnosis not present

## 2017-01-16 DIAGNOSIS — Z87891 Personal history of nicotine dependence: Secondary | ICD-10-CM | POA: Insufficient documentation

## 2017-01-16 DIAGNOSIS — G4730 Sleep apnea, unspecified: Secondary | ICD-10-CM | POA: Diagnosis not present

## 2017-01-16 DIAGNOSIS — I10 Essential (primary) hypertension: Secondary | ICD-10-CM | POA: Insufficient documentation

## 2017-01-16 HISTORY — PX: CARPAL TUNNEL RELEASE: SHX101

## 2017-01-16 HISTORY — DX: Other specified health status: Z78.9

## 2017-01-16 SURGERY — CARPAL TUNNEL RELEASE
Anesthesia: Regional | Site: Wrist | Laterality: Right

## 2017-01-16 MED ORDER — DEXAMETHASONE SODIUM PHOSPHATE 10 MG/ML IJ SOLN
INTRAMUSCULAR | Status: DC | PRN
  Administered 2017-01-16: 10 mg via INTRAVENOUS

## 2017-01-16 MED ORDER — FENTANYL CITRATE (PF) 100 MCG/2ML IJ SOLN
INTRAMUSCULAR | Status: AC
Start: 2017-01-16 — End: 2017-01-16
  Filled 2017-01-16: qty 2

## 2017-01-16 MED ORDER — ONDANSETRON HCL 4 MG/2ML IJ SOLN
INTRAMUSCULAR | Status: AC
  Filled 2017-01-16: qty 2

## 2017-01-16 MED ORDER — FENTANYL CITRATE (PF) 100 MCG/2ML IJ SOLN: 50.0000 ug | INTRAMUSCULAR | Status: DC | PRN

## 2017-01-16 MED ORDER — MIDAZOLAM HCL 2 MG/2ML IJ SOLN: 1.0000 mg | INTRAMUSCULAR | Status: DC | PRN

## 2017-01-16 MED ORDER — ONDANSETRON HCL 4 MG/2ML IJ SOLN
INTRAMUSCULAR | Status: DC | PRN
  Administered 2017-01-16: 4 mg via INTRAVENOUS

## 2017-01-16 MED ORDER — ONDANSETRON HCL 4 MG/2ML IJ SOLN: 4.0000 mg | Freq: Once | INTRAMUSCULAR | Status: DC | PRN

## 2017-01-16 MED ORDER — LIDOCAINE HCL (PF) 0.5 % IJ SOLN
INTRAMUSCULAR | Status: DC | PRN
  Administered 2017-01-16: 30 mL via INTRAVENOUS

## 2017-01-16 MED ORDER — BUPIVACAINE HCL (PF) 0.25 % IJ SOLN
INTRAMUSCULAR | Status: AC
  Filled 2017-01-16: qty 60

## 2017-01-16 MED ORDER — VANCOMYCIN HCL IN DEXTROSE 1-5 GM/200ML-% IV SOLN
INTRAVENOUS | Status: AC
  Filled 2017-01-16: qty 200

## 2017-01-16 MED ORDER — PROPOFOL 500 MG/50ML IV EMUL
INTRAVENOUS | Status: AC
  Filled 2017-01-16: qty 50

## 2017-01-16 MED ORDER — PROPOFOL 10 MG/ML IV BOLUS
INTRAVENOUS | Status: AC
  Filled 2017-01-16: qty 20

## 2017-01-16 MED ORDER — PROPOFOL 500 MG/50ML IV EMUL
INTRAVENOUS | Status: DC | PRN
  Administered 2017-01-16: 50 ug/kg/min via INTRAVENOUS

## 2017-01-16 MED ORDER — PROPOFOL 10 MG/ML IV BOLUS
INTRAVENOUS | Status: DC | PRN
  Administered 2017-01-16 (×2): 30 mg via INTRAVENOUS

## 2017-01-16 MED ORDER — HYDROCODONE-ACETAMINOPHEN 5-325 MG PO TABS: 1.0000 | ORAL_TABLET | Freq: Four times a day (QID) | ORAL | 0 refills | Status: AC | PRN

## 2017-01-16 MED ORDER — VANCOMYCIN HCL IN DEXTROSE 1-5 GM/200ML-% IV SOLN
1000.0000 mg | INTRAVENOUS | Status: AC
  Administered 2017-01-16: 1000 mg via INTRAVENOUS

## 2017-01-16 MED ORDER — DEXAMETHASONE SODIUM PHOSPHATE 10 MG/ML IJ SOLN
INTRAMUSCULAR | Status: AC
  Filled 2017-01-16: qty 1

## 2017-01-16 MED ORDER — FENTANYL CITRATE (PF) 100 MCG/2ML IJ SOLN
INTRAMUSCULAR | Status: DC | PRN
  Administered 2017-01-16: 25 ug via INTRAVENOUS

## 2017-01-16 MED ORDER — BUPIVACAINE HCL (PF) 0.25 % IJ SOLN
INTRAMUSCULAR | Status: DC | PRN
  Administered 2017-01-16: 8 mL

## 2017-01-16 MED ORDER — SCOPOLAMINE 1 MG/3DAYS TD PT72: 1.0000 | MEDICATED_PATCH | Freq: Once | TRANSDERMAL | Status: DC | PRN

## 2017-01-16 MED ORDER — FENTANYL CITRATE (PF) 100 MCG/2ML IJ SOLN: 25.0000 ug | INTRAMUSCULAR | Status: DC | PRN

## 2017-01-16 MED ORDER — LACTATED RINGERS IV SOLN
INTRAVENOUS | Status: DC
  Administered 2017-01-16 (×2): via INTRAVENOUS

## 2017-01-16 MED ORDER — CHLORHEXIDINE GLUCONATE 4 % EX LIQD: 60.0000 mL | Freq: Once | CUTANEOUS | Status: DC

## 2017-01-16 SURGICAL SUPPLY — 35 items
BLADE SURG 15 STRL LF DISP TIS (BLADE) ×1 IMPLANT
BLADE SURG 15 STRL SS (BLADE) ×3
BNDG CMPR 9X4 STRL LF SNTH (GAUZE/BANDAGES/DRESSINGS) ×1
BNDG COHESIVE 3X5 TAN STRL LF (GAUZE/BANDAGES/DRESSINGS) ×3 IMPLANT
BNDG ESMARK 4X9 LF (GAUZE/BANDAGES/DRESSINGS) ×3 IMPLANT
BNDG GAUZE ELAST 4 BULKY (GAUZE/BANDAGES/DRESSINGS) ×3 IMPLANT
CHLORAPREP W/TINT 26ML (MISCELLANEOUS) ×3 IMPLANT
CORD BIPOLAR FORCEPS 12FT (ELECTRODE) ×3 IMPLANT
COVER BACK TABLE 60X90IN (DRAPES) ×3 IMPLANT
COVER MAYO STAND STRL (DRAPES) ×3 IMPLANT
CUFF TOURNIQUET SINGLE 18IN (TOURNIQUET CUFF) ×3 IMPLANT
DRAPE EXTREMITY T 121X128X90 (DRAPE) ×3 IMPLANT
DRAPE SURG 17X23 STRL (DRAPES) ×3 IMPLANT
DRSG PAD ABDOMINAL 8X10 ST (GAUZE/BANDAGES/DRESSINGS) ×3 IMPLANT
GAUZE SPONGE 4X4 12PLY STRL (GAUZE/BANDAGES/DRESSINGS) ×3 IMPLANT
GAUZE XEROFORM 1X8 LF (GAUZE/BANDAGES/DRESSINGS) ×3 IMPLANT
GLOVE BIOGEL PI IND STRL 7.0 (GLOVE) ×2 IMPLANT
GLOVE BIOGEL PI IND STRL 8.5 (GLOVE) ×1 IMPLANT
GLOVE BIOGEL PI INDICATOR 7.0 (GLOVE) ×4
GLOVE BIOGEL PI INDICATOR 8.5 (GLOVE) ×2
GLOVE ECLIPSE 6.5 STRL STRAW (GLOVE) ×3 IMPLANT
GLOVE SURG ORTHO 8.0 STRL STRW (GLOVE) ×3 IMPLANT
GOWN STRL REUS W/ TWL LRG LVL3 (GOWN DISPOSABLE) ×1 IMPLANT
GOWN STRL REUS W/TWL LRG LVL3 (GOWN DISPOSABLE) ×3
GOWN STRL REUS W/TWL XL LVL3 (GOWN DISPOSABLE) ×3 IMPLANT
NEEDLE PRECISIONGLIDE 27X1.5 (NEEDLE) ×3 IMPLANT
NS IRRIG 1000ML POUR BTL (IV SOLUTION) ×3 IMPLANT
PACK BASIN DAY SURGERY FS (CUSTOM PROCEDURE TRAY) ×3 IMPLANT
STOCKINETTE 4X48 STRL (DRAPES) ×3 IMPLANT
SUT ETHILON 4 0 PS 2 18 (SUTURE) ×3 IMPLANT
SUT VICRYL 4-0 PS2 18IN ABS (SUTURE) IMPLANT
SYR BULB 3OZ (MISCELLANEOUS) ×3 IMPLANT
SYR CONTROL 10ML LL (SYRINGE) ×3 IMPLANT
TOWEL OR 17X24 6PK STRL BLUE (TOWEL DISPOSABLE) ×3 IMPLANT
UNDERPAD 30X30 (UNDERPADS AND DIAPERS) IMPLANT

## 2017-01-16 NOTE — Transfer of Care (Signed)
Immediate Anesthesia Transfer of Care Note  Patient: Donald [name]  Procedure(s) Performed: RIGHT CARPAL TUNNEL RELEASE (Right Wrist)  Patient Location: PACU  Anesthesia Type:MAC and Bier block  Level of Consciousness: awake, alert  and oriented  Airway & Oxygen Therapy: Patient Spontanous Breathing and Patient connected to face mask oxygen  Post-op Assessment: Report given to RN and Post -op Vital signs reviewed and stable  Post vital signs: Reviewed and stable  Last Vitals:  Vitals:   01/16/17 1010 01/16/17 1011  BP:    Pulse: 69 64  Resp:  12  Temp:    SpO2: 100% 100%    Last Pain:  Vitals:   01/16/17 0850  TempSrc: Oral  PainSc: 0-No pain         Complications: No apparent anesthesia complications

## 2017-01-16 NOTE — Discharge Instructions (Addendum)

## 2017-01-16 NOTE — Op Note (Signed)
Dictation Number [id number]

## 2017-01-16 NOTE — Anesthesia Postprocedure Evaluation (Signed)
Anesthesia Post Note  Patient: Donald [name]  Procedure(s) Performed: RIGHT CARPAL TUNNEL RELEASE (Right Wrist)     Patient location during evaluation: PACU Anesthesia Type: Bier Block Level of consciousness: awake and alert Pain management: pain level controlled Vital Signs Assessment: post-procedure vital signs reviewed and stable Respiratory status: spontaneous breathing, nonlabored ventilation and respiratory function stable Cardiovascular status: stable and blood pressure returned to baseline Postop Assessment: no apparent nausea or vomiting Anesthetic complications: no    Last Vitals:  Vitals:   01/16/17 1034 01/16/17 1041  BP:  (!) 175/82  Pulse: 85   Resp: 18   Temp: 36.5 C   SpO2: 100%     Last Pain:  Vitals:   01/16/17 1039  TempSrc:   PainSc: 0-No pain                 Catalina Gravel

## 2017-01-16 NOTE — Anesthesia Preprocedure Evaluation (Signed)
Anesthesia Evaluation  Patient identified by MRN, date of birth, ID band Patient awake    Reviewed: Allergy & Precautions, NPO status , Patient's Chart, lab work & pertinent test results  Airway Mallampati: II  TM Distance: >3 FB Neck ROM: Full    Dental  (+) Teeth Intact, Dental Advisory Given   Pulmonary sleep apnea , former smoker,    Pulmonary exam normal breath sounds clear to auscultation       Cardiovascular hypertension, Pt. on home beta blockers Normal cardiovascular exam Rhythm:Regular Rate:Normal     Neuro/Psych negative neurological ROS  negative psych ROS   GI/Hepatic (+)     substance abuse  alcohol use,   Endo/Other  Obesity   Renal/GU negative Renal ROS   Bladder cancer     Musculoskeletal negative musculoskeletal ROS (+)   Abdominal   Peds  Hematology negative hematology ROS (+)   Anesthesia Other Findings Day of surgery medications reviewed with the patient.  Reproductive/Obstetrics                             Anesthesia Physical Anesthesia Plan  ASA: II  Anesthesia Plan: Bier Block   Post-op Pain Management:    Induction: Intravenous  PONV Risk Score and Plan: 2 and Ondansetron and Dexamethasone  Airway Management Planned: Simple Face Mask  Additional Equipment:   Intra-op Plan:   Post-operative Plan:   Informed Consent: I have reviewed the patients History and Physical, chart, labs and discussed the procedure including the risks, benefits and alternatives for the proposed anesthesia with the patient or authorized representative who has indicated his/her understanding and acceptance.   Dental advisory given  Plan Discussed with: CRNA  Anesthesia Plan Comments: (Risks/benefits of regional block discussed with patient including risk of bleeding, infection, nerve damage, and possibility of failed block.  Also discussed backup plan of general  anesthesia and associated risks.  Patient wishes to proceed.)        Anesthesia Quick Evaluation
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
## 2017-01-16 NOTE — Op Note (Signed)
NAME:  TRAY, KLAYMAN NO.:  [id number]  MEDICAL RECORD NO.:  [id number]  LOCATION:                                 FACILITY:  PHYSICIAN:  Daryll Brod, M.D.            DATE OF BIRTH:  DATE OF PROCEDURE:  01/16/2017 DATE OF DISCHARGE:                              OPERATIVE REPORT   PREOPERATIVE DIAGNOSIS:  Carpal tunnel syndrome, right hand.  POSTOPERATIVE DIAGNOSIS:  Carpal tunnel syndrome, right hand.  OPERATION:  Decompression of right median nerve.  SURGEON:  Daryll Brod, M.D.  ANESTHESIA:  Forearm-based IV regional with local infiltration.  PLACE OF SURGERY:  [facility].  ANESTHESIOLOGIST:  Gifford Shave.  HISTORY:  The patient is a 71 year old male with a history of carpal tunnel syndrome.  EMG nerve conduction is positive and not responsive to conservative treatment.  He has elected to undergo surgical decompression of the median nerve, right hand.  Pre, peri, and postoperative course have been discussed along with risks and complications.  He is aware that there is no guarantee to the surgery, the possibility of infection; recurrence of injury to arteries, nerves, tendons; incomplete relief of symptoms; dystrophy.  In preoperative area, the patient is seen, the extremity marked by both patient and surgeon.  Antibiotic given.  DESCRIPTION OF PROCEDURE:  The patient was brought to the operating room, where a forearm-based IV regional anesthetic was carried out without difficulty.  He was prepped using ChloraPrep in the supine position with the right arm free.  A 3-minute dry time was allowed and time-out taken, confirming the patient and procedure.  A longitudinal incision was made in the right palm, carried down through subcutaneous tissue.  Bleeders were electrocauterized with bipolar.  The palmar fascia was split.  The superficial palmar arch was identified.  The flexor tendon to the ring and little finger was identified.  To the ulnar side  of the median nerve, the carpal retinaculum was incised with sharp dissection after placing retractors retracting the median nerve and flexor tendons radially and the ulnar nerve ulnarly.  A right angle and Sewall retractor were placed between skin and forearm fascia.  The deep structure was dissected free from the fascia using blunt dissection.  The proximal aspect of flexor retinaculum and distal forearm fascia were then incised with sharp dissection with minimally open blunt scissors.  This was done for approximately 1.5 cm to 2 cm proximal to the wrist crease.  The canal was explored.  An area of compression to the nerve was apparent.  Motor branch entered into muscle distally.  The wound was copiously irrigated with saline.  The skin was closed with interrupted 4-0 nylon sutures.  A local infiltration with 0.25% bupivacaine without epinephrine was given.  Approximately 8 mL was used.  A sterile compressive dressing with the fingers free was applied.  On deflation of the tourniquet, all fingers immediately pinked.  He was taken to the recovery room for observation in satisfactory condition.  He will be discharged home, to return to [place] in 1 week, on Nor[place].          ______________________________ Dominica Severin  Fredna Dow, M.D.     Aretta Nip  D:  01/16/2017  T:  01/16/2017  Job:  [id number]

## 2017-01-16 NOTE — Anesthesia Procedure Notes (Signed)
Anesthesia Regional Block: Bier block (IV Regional)   Pre-Anesthetic Checklist: ,, timeout performed, Correct Patient, Correct Site, Correct Laterality, Correct Procedure, Correct Position, site marked, Risks and benefits discussed, Surgical consent,  Pre-op evaluation,  At surgeon's request  Laterality: Right  Prep: chloraprep       Needles:  Injection technique: Single-shot      Additional Needles:   Procedures:,,,,, intact distal pulses, Esmarch exsanguination, single tourniquet utilized, #20gu IV placed  Narrative:   Performed by: [employer]

## 2017-01-16 NOTE — Brief Op Note (Signed)
01/16/2017  10:11 AM  PATIENT:  Donald [name]  71 y.o. male  PRE-OPERATIVE DIAGNOSIS:  right CARPAL TUNNEL SYNDROME  POST-OPERATIVE DIAGNOSIS:  RIGHT CARPAL TUNNEL SYNDROME  PROCEDURE:  Procedure(s): RIGHT CARPAL TUNNEL RELEASE (Right)  SURGEON:  Surgeon(s) and Role:    Daryll Brod, MD - Primary  PHYSICIAN ASSISTANT:   ASSISTANTS: none   ANESTHESIA:   local and regional  EBL:  No intake/output data recorded.  BLOOD ADMINISTERED:none  DRAINS: none   LOCAL MEDICATIONS USED:  BUPIVICAINE   SPECIMEN:  No Specimen  DISPOSITION OF SPECIMEN:  N/A  COUNTS:  YES  TOURNIQUET:   Total Tourniquet Time Documented: Forearm (Right) - 24 minutes Total: Forearm (Right) - 24 minutes   DICTATION: .Other Dictation: Dictation Number [id number]  PLAN OF CARE: Discharge to home after PACU  PATIENT DISPOSITION:  PACU - hemodynamically stable.

## 2017-01-17 ENCOUNTER — Encounter (HOSPITAL_BASED_OUTPATIENT_CLINIC_OR_DEPARTMENT_OTHER): Payer: Self-pay | Admitting: Orthopedic Surgery

## 2017-01-21 ENCOUNTER — Ambulatory Visit: Payer: Medicare Other | Admitting: Nurse Practitioner

## 2017-07-11 DIAGNOSIS — C670 Malignant neoplasm of trigone of bladder: Secondary | ICD-10-CM | POA: Diagnosis not present

## 2017-07-11 DIAGNOSIS — M109 Gout, unspecified: Secondary | ICD-10-CM | POA: Diagnosis not present

## 2017-07-11 DIAGNOSIS — Z23 Encounter for immunization: Secondary | ICD-10-CM | POA: Diagnosis not present

## 2017-07-11 DIAGNOSIS — I10 Essential (primary) hypertension: Secondary | ICD-10-CM | POA: Diagnosis not present

## 2017-07-11 DIAGNOSIS — S46212A Strain of muscle, fascia and tendon of other parts of biceps, left arm, initial encounter: Secondary | ICD-10-CM | POA: Diagnosis not present

## 2017-07-11 DIAGNOSIS — E785 Hyperlipidemia, unspecified: Secondary | ICD-10-CM | POA: Diagnosis not present

## 2017-08-05 DIAGNOSIS — M7542 Impingement syndrome of left shoulder: Secondary | ICD-10-CM | POA: Diagnosis not present

## 2017-10-10 DIAGNOSIS — C678 Malignant neoplasm of overlapping sites of bladder: Secondary | ICD-10-CM | POA: Diagnosis not present

## 2017-10-21 ENCOUNTER — Emergency Department (HOSPITAL_COMMUNITY)
Admission: EM | Admit: 2017-10-21 | Discharge: 2017-10-21 | Disposition: A | Discharge status: Home / Self Care | Payer: Medicare Other | Attending: Emergency Medicine | Admitting: Emergency Medicine

## 2017-10-21 ENCOUNTER — Encounter (HOSPITAL_COMMUNITY): Payer: Self-pay | Admitting: Family Medicine

## 2017-10-21 ENCOUNTER — Emergency Department (HOSPITAL_COMMUNITY): Payer: Medicare Other

## 2017-10-21 DIAGNOSIS — Y907 Blood alcohol level of 200-239 mg/100 ml: Secondary | ICD-10-CM | POA: Diagnosis not present

## 2017-10-21 DIAGNOSIS — F1020 Alcohol dependence, uncomplicated: Secondary | ICD-10-CM | POA: Insufficient documentation

## 2017-10-21 DIAGNOSIS — Z79899 Other long term (current) drug therapy: Secondary | ICD-10-CM | POA: Insufficient documentation

## 2017-10-21 DIAGNOSIS — Z7982 Long term (current) use of aspirin: Secondary | ICD-10-CM | POA: Diagnosis not present

## 2017-10-21 DIAGNOSIS — I959 Hypotension, unspecified: Secondary | ICD-10-CM | POA: Diagnosis not present

## 2017-10-21 DIAGNOSIS — S0990XA Unspecified injury of head, initial encounter: Secondary | ICD-10-CM | POA: Diagnosis not present

## 2017-10-21 DIAGNOSIS — Y939 Activity, unspecified: Secondary | ICD-10-CM | POA: Diagnosis not present

## 2017-10-21 DIAGNOSIS — Y999 Unspecified external cause status: Secondary | ICD-10-CM | POA: Diagnosis not present

## 2017-10-21 DIAGNOSIS — S0081XA Abrasion of other part of head, initial encounter: Secondary | ICD-10-CM | POA: Insufficient documentation

## 2017-10-21 DIAGNOSIS — F0390 Unspecified dementia without behavioral disturbance: Secondary | ICD-10-CM | POA: Insufficient documentation

## 2017-10-21 DIAGNOSIS — X58XXXA Exposure to other specified factors, initial encounter: Secondary | ICD-10-CM | POA: Diagnosis not present

## 2017-10-21 DIAGNOSIS — F10129 Alcohol abuse with intoxication, unspecified: Secondary | ICD-10-CM | POA: Diagnosis not present

## 2017-10-21 DIAGNOSIS — S199XXA Unspecified injury of neck, initial encounter: Secondary | ICD-10-CM | POA: Diagnosis not present

## 2017-10-21 DIAGNOSIS — W19XXXA Unspecified fall, initial encounter: Secondary | ICD-10-CM | POA: Diagnosis not present

## 2017-10-21 DIAGNOSIS — Y92821 Forest as the place of occurrence of the external cause: Secondary | ICD-10-CM | POA: Insufficient documentation

## 2017-10-21 DIAGNOSIS — Z87891 Personal history of nicotine dependence: Secondary | ICD-10-CM | POA: Insufficient documentation

## 2017-10-21 DIAGNOSIS — F10920 Alcohol use, unspecified with intoxication, uncomplicated: Secondary | ICD-10-CM

## 2017-10-21 DIAGNOSIS — I10 Essential (primary) hypertension: Secondary | ICD-10-CM | POA: Diagnosis not present

## 2017-10-21 DIAGNOSIS — F10929 Alcohol use, unspecified with intoxication, unspecified: Secondary | ICD-10-CM | POA: Diagnosis not present

## 2017-10-21 LAB — COMPREHENSIVE METABOLIC PANEL
ALT: 20 U/L (ref 0–44)
ANION GAP: 11 (ref 5–15)
AST: 27 U/L (ref 15–41)
Albumin: 4.6 g/dL (ref 3.5–5.0)
Alkaline Phosphatase: 51 U/L (ref 38–126)
BILIRUBIN TOTAL: 0.8 mg/dL (ref 0.3–1.2)
BUN: 17 mg/dL (ref 8–23)
CO2: 26 mmol/L (ref 22–32)
Calcium: 9.7 mg/dL (ref 8.9–10.3)
Chloride: 104 mmol/L (ref 98–111)
Creatinine, Ser: 0.98 mg/dL (ref 0.61–1.24)
Glucose, Bld: 103 mg/dL — ABNORMAL HIGH (ref 70–99)
Potassium: 3.9 mmol/L (ref 3.5–5.1)
SODIUM: 141 mmol/L (ref 135–145)
TOTAL PROTEIN: 7.4 g/dL (ref 6.5–8.1)

## 2017-10-21 LAB — CBC
HEMATOCRIT: 38.4 % — AB (ref 39.0–52.0)
HEMOGLOBIN: 13.7 g/dL (ref 13.0–17.0)
MCH: 32.5 pg (ref 26.0–34.0)
MCHC: 35.7 g/dL (ref 30.0–36.0)
MCV: 91 fL (ref 78.0–100.0)
Platelets: 195 10*3/uL (ref 150–400)
RBC: 4.22 MIL/uL (ref 4.22–5.81)
RDW: 12.5 % (ref 11.5–15.5)
WBC: 7.5 10*3/uL (ref 4.0–10.5)

## 2017-10-21 LAB — ETHANOL: Alcohol, Ethyl (B): 224 mg/dL — ABNORMAL HIGH (ref <10)

## 2017-10-21 MED ORDER — SODIUM CHLORIDE 0.9 % IV BOLUS
1000.0000 mL | Freq: Once | INTRAVENOUS | Status: AC
  Administered 2017-10-21: 1000 mL via INTRAVENOUS
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
## 2017-10-21 NOTE — Discharge Instructions (Signed)
You were examined today for a head injury and alcohol intoxication. Your head and neck CT show no evidence of injury today.  Try and slowly decrease her alcohol intake to help preserve your liver function.  Please follow-up with your primary care doctor later this week for recheck.  Sometimes serious problems can develop after a head injury. Please return to the emergency department if you experience any of the following symptoms: Repeated vomiting Headache that gets worse and does not go away Loss of consciousness or inability to stay awake at times when you normally would be able to Getting more confused, restless or agitated Convulsions or seizures Difficulty walking or feeling off balance Weakness or numbness Vision changes

## 2017-10-21 NOTE — ED Triage Notes (Signed)
Patient is from home and transported via [facility]. Patient has a history of dementia. Apparently this evening he was drinking ETOH, and walking in a wooded area behind his house. Wife informed EMS that she found him sitting on the ground and possibly could have fallen. EMS reports patient has abrasions on his extremities and forehead. He has been placed in a c-collar by EMS due to ETOH use and unsure of events.

## 2017-10-21 NOTE — ED Provider Notes (Addendum)
  Face-to-face evaluation   History: Patient was wandering in the woods today after drinking alcohol and his wife found him with a wound on his forehead and right elbow.  He has had similar wandering episodes.  Physical exam: Alert elderly man.  Mild dysarthria is present.  No aphasia or nystagmus.  No respiratory distress.  He moves arms and legs equally, with normal strength.  Medical screening examination/treatment/procedure(s) were conducted as a shared visit with non-physician practitioner(s) and myself.  I personally evaluated the patient during the encounter        Daleen Bo, MD 10/22/17 [phone]

## 2017-10-21 NOTE — ED Notes (Signed)
Bed: [facility] Expected date:  Expected time:  Means of arrival:  Comments: EMS Fall 

## 2017-11-05 DIAGNOSIS — H5203 Hypermetropia, bilateral: Secondary | ICD-10-CM | POA: Diagnosis not present

## 2018-02-02 DIAGNOSIS — E785 Hyperlipidemia, unspecified: Secondary | ICD-10-CM | POA: Diagnosis not present

## 2018-02-02 DIAGNOSIS — Z23 Encounter for immunization: Secondary | ICD-10-CM | POA: Diagnosis not present

## 2018-02-02 DIAGNOSIS — Z0000 Encounter for general adult medical examination without abnormal findings: Secondary | ICD-10-CM | POA: Diagnosis not present

## 2018-02-02 DIAGNOSIS — I10 Essential (primary) hypertension: Secondary | ICD-10-CM | POA: Diagnosis not present

## 2018-02-04 DIAGNOSIS — Z0000 Encounter for general adult medical examination without abnormal findings: Secondary | ICD-10-CM | POA: Diagnosis not present

## 2018-02-04 DIAGNOSIS — E785 Hyperlipidemia, unspecified: Secondary | ICD-10-CM | POA: Diagnosis not present

## 2018-02-04 DIAGNOSIS — I10 Essential (primary) hypertension: Secondary | ICD-10-CM | POA: Diagnosis not present

## 2018-02-04 DIAGNOSIS — Z23 Encounter for immunization: Secondary | ICD-10-CM | POA: Diagnosis not present

## 2018-06-09 DIAGNOSIS — I10 Essential (primary) hypertension: Secondary | ICD-10-CM | POA: Diagnosis not present

## 2018-06-09 DIAGNOSIS — M109 Gout, unspecified: Secondary | ICD-10-CM | POA: Diagnosis not present

## 2018-06-09 DIAGNOSIS — E669 Obesity, unspecified: Secondary | ICD-10-CM | POA: Diagnosis not present

## 2018-06-09 DIAGNOSIS — E785 Hyperlipidemia, unspecified: Secondary | ICD-10-CM | POA: Diagnosis not present

## 2018-10-14 DIAGNOSIS — C678 Malignant neoplasm of overlapping sites of bladder: Secondary | ICD-10-CM | POA: Diagnosis not present

## 2018-10-19 DIAGNOSIS — E785 Hyperlipidemia, unspecified: Secondary | ICD-10-CM | POA: Diagnosis not present

## 2018-10-19 DIAGNOSIS — E669 Obesity, unspecified: Secondary | ICD-10-CM | POA: Diagnosis not present

## 2018-10-19 DIAGNOSIS — M109 Gout, unspecified: Secondary | ICD-10-CM | POA: Diagnosis not present

## 2018-10-19 DIAGNOSIS — I10 Essential (primary) hypertension: Secondary | ICD-10-CM | POA: Diagnosis not present

## 2018-10-29 DIAGNOSIS — I10 Essential (primary) hypertension: Secondary | ICD-10-CM | POA: Diagnosis not present

## 2018-10-29 DIAGNOSIS — M109 Gout, unspecified: Secondary | ICD-10-CM | POA: Diagnosis not present

## 2018-10-29 DIAGNOSIS — E669 Obesity, unspecified: Secondary | ICD-10-CM | POA: Diagnosis not present

## 2018-10-29 DIAGNOSIS — E785 Hyperlipidemia, unspecified: Secondary | ICD-10-CM | POA: Diagnosis not present

## 2018-11-10 DIAGNOSIS — H524 Presbyopia: Secondary | ICD-10-CM | POA: Diagnosis not present

## 2019-01-12 DIAGNOSIS — Z0120 Encounter for dental examination and cleaning without abnormal findings: Secondary | ICD-10-CM | POA: Diagnosis not present

## 2019-02-08 DIAGNOSIS — M25511 Pain in right shoulder: Secondary | ICD-10-CM | POA: Diagnosis not present

## 2019-02-18 DIAGNOSIS — M109 Gout, unspecified: Secondary | ICD-10-CM | POA: Diagnosis not present

## 2019-02-18 DIAGNOSIS — E785 Hyperlipidemia, unspecified: Secondary | ICD-10-CM | POA: Diagnosis not present

## 2019-02-18 DIAGNOSIS — I10 Essential (primary) hypertension: Secondary | ICD-10-CM | POA: Diagnosis not present

## 2019-02-18 DIAGNOSIS — Z0000 Encounter for general adult medical examination without abnormal findings: Secondary | ICD-10-CM | POA: Diagnosis not present

## 2019-03-05 DIAGNOSIS — E785 Hyperlipidemia, unspecified: Secondary | ICD-10-CM | POA: Diagnosis not present

## 2019-03-05 DIAGNOSIS — Z0000 Encounter for general adult medical examination without abnormal findings: Secondary | ICD-10-CM | POA: Diagnosis not present

## 2019-03-05 DIAGNOSIS — Z23 Encounter for immunization: Secondary | ICD-10-CM | POA: Diagnosis not present

## 2019-03-05 DIAGNOSIS — I10 Essential (primary) hypertension: Secondary | ICD-10-CM | POA: Diagnosis not present

## 2019-03-26 DIAGNOSIS — E785 Hyperlipidemia, unspecified: Secondary | ICD-10-CM | POA: Diagnosis not present

## 2019-03-26 DIAGNOSIS — M109 Gout, unspecified: Secondary | ICD-10-CM | POA: Diagnosis not present

## 2019-03-26 DIAGNOSIS — I10 Essential (primary) hypertension: Secondary | ICD-10-CM | POA: Diagnosis not present

## 2019-03-26 DIAGNOSIS — M25511 Pain in right shoulder: Secondary | ICD-10-CM | POA: Diagnosis not present

## 2019-04-23 DIAGNOSIS — I10 Essential (primary) hypertension: Secondary | ICD-10-CM | POA: Diagnosis not present

## 2019-04-26 DIAGNOSIS — Z0120 Encounter for dental examination and cleaning without abnormal findings: Secondary | ICD-10-CM | POA: Diagnosis not present

## 2019-06-21 DIAGNOSIS — E669 Obesity, unspecified: Secondary | ICD-10-CM | POA: Diagnosis not present

## 2019-06-21 DIAGNOSIS — I10 Essential (primary) hypertension: Secondary | ICD-10-CM | POA: Diagnosis not present

## 2019-06-21 DIAGNOSIS — M109 Gout, unspecified: Secondary | ICD-10-CM | POA: Diagnosis not present

## 2019-06-21 DIAGNOSIS — E785 Hyperlipidemia, unspecified: Secondary | ICD-10-CM | POA: Diagnosis not present

## 2019-06-30 DIAGNOSIS — E669 Obesity, unspecified: Secondary | ICD-10-CM | POA: Diagnosis not present

## 2019-06-30 DIAGNOSIS — M109 Gout, unspecified: Secondary | ICD-10-CM | POA: Diagnosis not present

## 2019-06-30 DIAGNOSIS — I10 Essential (primary) hypertension: Secondary | ICD-10-CM | POA: Diagnosis not present

## 2019-06-30 DIAGNOSIS — E785 Hyperlipidemia, unspecified: Secondary | ICD-10-CM | POA: Diagnosis not present

## 2019-07-01 ENCOUNTER — Ambulatory Visit: Payer: Medicare Other | Attending: Internal Medicine

## 2019-07-01 DIAGNOSIS — Z23 Encounter for immunization: Secondary | ICD-10-CM

## 2019-07-01 NOTE — Progress Notes (Signed)
   Covid-19 Vaccination Clinic  Name:  Donald [name]    MRN: [id number] DOB: 1945-05-13  07/01/2019  Donald [name] was observed post Covid-19 immunization for 15 minutes without incident. He was provided with Vaccine Information Sheet and instruction to access the V-Safe system.   Donald [name] was instructed to call 911 with any severe reactions post vaccine: Marland Kitchen Difficulty breathing  . Swelling of face and throat  . A fast heartbeat  . A bad rash all over body  . Dizziness and weakness   Immunizations Administered    Name Date Dose VIS Date Route   Pfizer COVID-19 Vaccine 07/01/2019  3:27 PM 0.3 mL 03/26/2019 Intramuscular   Manufacturer: [place]   Lot: [id number]   [place]: [id number]

## 2019-07-11 DIAGNOSIS — M6530 Trigger finger, unspecified finger: Secondary | ICD-10-CM | POA: Diagnosis not present

## 2019-07-26 ENCOUNTER — Ambulatory Visit: Payer: Medicare Other | Attending: Internal Medicine

## 2019-07-26 DIAGNOSIS — Z23 Encounter for immunization: Secondary | ICD-10-CM

## 2019-07-26 NOTE — Progress Notes (Signed)
   Covid-19 Vaccination Clinic  Name:  Donald [name]    MRN: [id number] DOB: 1946/01/25  07/26/2019  [name] was observed post Covid-19 immunization for 15 minutes without incident. He was provided with Vaccine Information Sheet and instruction to access the V-Safe system.   [name] was instructed to call 911 with any severe reactions post vaccine: Marland Kitchen Difficulty breathing  . Swelling of face and throat  . A fast heartbeat  . A bad rash all over body  . Dizziness and weakness   Immunizations Administered    Name Date Dose VIS Date Route   Pfizer COVID-19 Vaccine 07/26/2019  3:53 PM 0.3 mL 03/26/2019 Intramuscular   Manufacturer: [place]   Lot: [id number]   [place]: [id number]

## 2019-08-24 DIAGNOSIS — Z0120 Encounter for dental examination and cleaning without abnormal findings: Secondary | ICD-10-CM | POA: Diagnosis not present

## 2019-10-21 ENCOUNTER — Ambulatory Visit
Admission: RE | Admit: 2019-10-21 | Discharge: 2019-10-21 | Disposition: A | Discharge status: Home / Self Care | Payer: Medicare Other | Source: Ambulatory Visit | Attending: Family Medicine | Admitting: Family Medicine

## 2019-10-21 ENCOUNTER — Other Ambulatory Visit: Payer: Self-pay | Admitting: Family Medicine

## 2019-10-21 ENCOUNTER — Other Ambulatory Visit: Payer: Self-pay

## 2019-10-21 DIAGNOSIS — M7989 Other specified soft tissue disorders: Secondary | ICD-10-CM | POA: Diagnosis not present

## 2019-10-21 DIAGNOSIS — M12842 Other specific arthropathies, not elsewhere classified, left hand: Secondary | ICD-10-CM | POA: Diagnosis not present

## 2019-10-21 DIAGNOSIS — I10 Essential (primary) hypertension: Secondary | ICD-10-CM | POA: Diagnosis not present

## 2019-10-21 DIAGNOSIS — M109 Gout, unspecified: Secondary | ICD-10-CM | POA: Diagnosis not present

## 2019-10-21 DIAGNOSIS — E785 Hyperlipidemia, unspecified: Secondary | ICD-10-CM | POA: Diagnosis not present

## 2019-10-21 DIAGNOSIS — M19042 Primary osteoarthritis, left hand: Secondary | ICD-10-CM | POA: Diagnosis not present

## 2019-10-21 DIAGNOSIS — R609 Edema, unspecified: Secondary | ICD-10-CM

## 2019-10-21 DIAGNOSIS — E669 Obesity, unspecified: Secondary | ICD-10-CM | POA: Diagnosis not present

## 2019-10-29 DIAGNOSIS — C678 Malignant neoplasm of overlapping sites of bladder: Secondary | ICD-10-CM | POA: Diagnosis not present

## 2019-11-22 DIAGNOSIS — R6889 Other general symptoms and signs: Secondary | ICD-10-CM | POA: Diagnosis not present

## 2019-11-22 DIAGNOSIS — I10 Essential (primary) hypertension: Secondary | ICD-10-CM | POA: Diagnosis not present

## 2019-11-22 DIAGNOSIS — F079 Unspecified personality and behavioral disorder due to known physiological condition: Secondary | ICD-10-CM | POA: Diagnosis not present

## 2019-11-22 DIAGNOSIS — R413 Other amnesia: Secondary | ICD-10-CM | POA: Diagnosis not present

## 2019-11-23 DIAGNOSIS — H5203 Hypermetropia, bilateral: Secondary | ICD-10-CM | POA: Diagnosis not present

## 2019-12-28 DIAGNOSIS — Z0120 Encounter for dental examination and cleaning without abnormal findings: Secondary | ICD-10-CM | POA: Diagnosis not present

## 2020-01-19 ENCOUNTER — Emergency Department (HOSPITAL_BASED_OUTPATIENT_CLINIC_OR_DEPARTMENT_OTHER): Payer: Medicare Other

## 2020-01-19 ENCOUNTER — Emergency Department (HOSPITAL_BASED_OUTPATIENT_CLINIC_OR_DEPARTMENT_OTHER)
Admission: EM | Admit: 2020-01-19 | Discharge: 2020-01-20 | Disposition: A | Discharge status: Home / Self Care | Payer: Medicare Other | Attending: Emergency Medicine | Admitting: Emergency Medicine

## 2020-01-19 ENCOUNTER — Encounter (HOSPITAL_BASED_OUTPATIENT_CLINIC_OR_DEPARTMENT_OTHER): Payer: Self-pay | Admitting: *Deleted

## 2020-01-19 ENCOUNTER — Other Ambulatory Visit: Payer: Self-pay

## 2020-01-19 DIAGNOSIS — Z7982 Long term (current) use of aspirin: Secondary | ICD-10-CM | POA: Insufficient documentation

## 2020-01-19 DIAGNOSIS — W19XXXA Unspecified fall, initial encounter: Secondary | ICD-10-CM | POA: Diagnosis not present

## 2020-01-19 DIAGNOSIS — I10 Essential (primary) hypertension: Secondary | ICD-10-CM | POA: Insufficient documentation

## 2020-01-19 DIAGNOSIS — Y92009 Unspecified place in unspecified non-institutional (private) residence as the place of occurrence of the external cause: Secondary | ICD-10-CM | POA: Insufficient documentation

## 2020-01-19 DIAGNOSIS — S065X0A Traumatic subdural hemorrhage without loss of consciousness, initial encounter: Secondary | ICD-10-CM | POA: Diagnosis not present

## 2020-01-19 DIAGNOSIS — S0990XA Unspecified injury of head, initial encounter: Secondary | ICD-10-CM | POA: Diagnosis present

## 2020-01-19 DIAGNOSIS — S00211A Abrasion of right eyelid and periocular area, initial encounter: Secondary | ICD-10-CM | POA: Diagnosis not present

## 2020-01-19 DIAGNOSIS — S0031XA Abrasion of nose, initial encounter: Secondary | ICD-10-CM | POA: Insufficient documentation

## 2020-01-19 DIAGNOSIS — S0083XA Contusion of other part of head, initial encounter: Secondary | ICD-10-CM

## 2020-01-19 DIAGNOSIS — Z87891 Personal history of nicotine dependence: Secondary | ICD-10-CM | POA: Insufficient documentation

## 2020-01-19 DIAGNOSIS — M25561 Pain in right knee: Secondary | ICD-10-CM | POA: Diagnosis not present

## 2020-01-19 DIAGNOSIS — Z79899 Other long term (current) drug therapy: Secondary | ICD-10-CM | POA: Diagnosis not present

## 2020-01-19 DIAGNOSIS — Z043 Encounter for examination and observation following other accident: Secondary | ICD-10-CM | POA: Diagnosis not present

## 2020-01-19 DIAGNOSIS — S6991XA Unspecified injury of right wrist, hand and finger(s), initial encounter: Secondary | ICD-10-CM | POA: Diagnosis not present

## 2020-01-19 DIAGNOSIS — S0181XA Laceration without foreign body of other part of head, initial encounter: Secondary | ICD-10-CM | POA: Diagnosis not present

## 2020-01-19 DIAGNOSIS — Z8551 Personal history of malignant neoplasm of bladder: Secondary | ICD-10-CM | POA: Insufficient documentation

## 2020-01-19 DIAGNOSIS — F0390 Unspecified dementia without behavioral disturbance: Secondary | ICD-10-CM | POA: Insufficient documentation

## 2020-01-19 NOTE — ED Triage Notes (Signed)
Wife states pt left house wondering , EMS called wife with c/o fall , abrasion to left and right knee . C/o right shoulder pain and lac above right eye. Wife states dementia

## 2020-01-20 MED ORDER — LIDOCAINE-EPINEPHRINE 2 %-1:100000 IJ SOLN
10.0000 mL | Freq: Once | INTRAMUSCULAR | Status: AC
  Administered 2020-01-20: 10 mL

## 2020-01-20 MED ORDER — LIDOCAINE-EPINEPHRINE (PF) 2 %-1:200000 IJ SOLN
INTRAMUSCULAR | Status: AC
  Filled 2020-01-20: qty 20

## 2020-01-20 NOTE — ED Notes (Signed)
Pt sitting up in chair. Pt is confused, and not comprehending need for sutures . Wife at bedside. Pt's is at baseline confusion per wife. Wife states pt has been wandering, becoming angry, and leaving the house with confusion for approximately 6 months.
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
## 2020-01-20 NOTE — ED Notes (Signed)
Assisted MD with dermabond. Pt tolerated well.

## 2020-01-25 DIAGNOSIS — W19XXXA Unspecified fall, initial encounter: Secondary | ICD-10-CM | POA: Diagnosis not present

## 2020-01-25 DIAGNOSIS — F079 Unspecified personality and behavioral disorder due to known physiological condition: Secondary | ICD-10-CM | POA: Diagnosis not present

## 2020-01-25 DIAGNOSIS — S065X9A Traumatic subdural hemorrhage with loss of consciousness of unspecified duration, initial encounter: Secondary | ICD-10-CM | POA: Diagnosis not present

## 2020-01-25 DIAGNOSIS — T07XXXA Unspecified multiple injuries, initial encounter: Secondary | ICD-10-CM | POA: Diagnosis not present

## 2020-02-01 DIAGNOSIS — S0990XA Unspecified injury of head, initial encounter: Secondary | ICD-10-CM | POA: Diagnosis not present

## 2020-02-09 DIAGNOSIS — K4090 Unilateral inguinal hernia, without obstruction or gangrene, not specified as recurrent: Secondary | ICD-10-CM | POA: Diagnosis not present

## 2020-02-12 ENCOUNTER — Ambulatory Visit: Payer: Medicare Other | Attending: Internal Medicine

## 2020-02-12 DIAGNOSIS — Z23 Encounter for immunization: Secondary | ICD-10-CM

## 2020-02-12 NOTE — Progress Notes (Signed)
   Covid-19 Vaccination Clinic  Name:  Donald [name]    MRN: [id number] DOB: 1945-09-04  02/12/2020  [name] was observed post Covid-19 immunization for 15 minutes without incident. He was provided with Vaccine Information Sheet and instruction to access the V-Safe system.   [name] was instructed to call 911 with any severe reactions post vaccine: Marland Kitchen Difficulty breathing  . Swelling of face and throat  . A fast heartbeat  . A bad rash all over body  . Dizziness and weakness

## 2020-02-14 ENCOUNTER — Other Ambulatory Visit: Payer: Self-pay | Admitting: Family Medicine

## 2020-02-14 DIAGNOSIS — R1909 Other intra-abdominal and pelvic swelling, mass and lump: Secondary | ICD-10-CM

## 2020-02-15 ENCOUNTER — Ambulatory Visit
Admission: RE | Admit: 2020-02-15 | Discharge: 2020-02-15 | Disposition: A | Discharge status: Home / Self Care | Payer: Medicare Other | Source: Ambulatory Visit | Attending: Family Medicine | Admitting: Family Medicine

## 2020-02-15 DIAGNOSIS — K4090 Unilateral inguinal hernia, without obstruction or gangrene, not specified as recurrent: Secondary | ICD-10-CM | POA: Diagnosis not present

## 2020-02-15 DIAGNOSIS — R1909 Other intra-abdominal and pelvic swelling, mass and lump: Secondary | ICD-10-CM

## 2020-02-16 ENCOUNTER — Other Ambulatory Visit: Payer: Medicare Other

## 2020-02-22 DIAGNOSIS — R6889 Other general symptoms and signs: Secondary | ICD-10-CM | POA: Diagnosis not present

## 2020-02-22 DIAGNOSIS — S065X9A Traumatic subdural hemorrhage with loss of consciousness of unspecified duration, initial encounter: Secondary | ICD-10-CM | POA: Diagnosis not present

## 2020-02-22 DIAGNOSIS — K4090 Unilateral inguinal hernia, without obstruction or gangrene, not specified as recurrent: Secondary | ICD-10-CM | POA: Diagnosis not present

## 2020-02-22 DIAGNOSIS — F079 Unspecified personality and behavioral disorder due to known physiological condition: Secondary | ICD-10-CM | POA: Diagnosis not present

## 2020-03-14 ENCOUNTER — Encounter (HOSPITAL_COMMUNITY): Payer: Self-pay

## 2020-03-14 ENCOUNTER — Emergency Department (HOSPITAL_COMMUNITY): Payer: Medicare Other

## 2020-03-14 ENCOUNTER — Emergency Department (HOSPITAL_COMMUNITY)
Admission: EM | Admit: 2020-03-14 | Discharge: 2020-03-14 | Disposition: A | Discharge status: Home / Self Care | Payer: Medicare Other | Attending: Emergency Medicine | Admitting: Emergency Medicine

## 2020-03-14 DIAGNOSIS — R1084 Generalized abdominal pain: Secondary | ICD-10-CM | POA: Diagnosis not present

## 2020-03-14 DIAGNOSIS — Z87891 Personal history of nicotine dependence: Secondary | ICD-10-CM | POA: Insufficient documentation

## 2020-03-14 DIAGNOSIS — W19XXXA Unspecified fall, initial encounter: Secondary | ICD-10-CM

## 2020-03-14 DIAGNOSIS — W01198A Fall on same level from slipping, tripping and stumbling with subsequent striking against other object, initial encounter: Secondary | ICD-10-CM | POA: Insufficient documentation

## 2020-03-14 DIAGNOSIS — S0990XA Unspecified injury of head, initial encounter: Secondary | ICD-10-CM | POA: Diagnosis present

## 2020-03-14 DIAGNOSIS — R52 Pain, unspecified: Secondary | ICD-10-CM | POA: Diagnosis not present

## 2020-03-14 DIAGNOSIS — F0390 Unspecified dementia without behavioral disturbance: Secondary | ICD-10-CM | POA: Diagnosis not present

## 2020-03-14 DIAGNOSIS — K5730 Diverticulosis of large intestine without perforation or abscess without bleeding: Secondary | ICD-10-CM | POA: Diagnosis not present

## 2020-03-14 DIAGNOSIS — S0511XA Contusion of eyeball and orbital tissues, right eye, initial encounter: Secondary | ICD-10-CM | POA: Insufficient documentation

## 2020-03-14 DIAGNOSIS — Z79899 Other long term (current) drug therapy: Secondary | ICD-10-CM | POA: Diagnosis not present

## 2020-03-14 DIAGNOSIS — N400 Enlarged prostate without lower urinary tract symptoms: Secondary | ICD-10-CM | POA: Diagnosis not present

## 2020-03-14 DIAGNOSIS — I10 Essential (primary) hypertension: Secondary | ICD-10-CM | POA: Diagnosis not present

## 2020-03-14 DIAGNOSIS — K4020 Bilateral inguinal hernia, without obstruction or gangrene, not specified as recurrent: Secondary | ICD-10-CM | POA: Diagnosis not present

## 2020-03-14 DIAGNOSIS — Z7982 Long term (current) use of aspirin: Secondary | ICD-10-CM | POA: Diagnosis not present

## 2020-03-14 DIAGNOSIS — R4182 Altered mental status, unspecified: Secondary | ICD-10-CM | POA: Insufficient documentation

## 2020-03-14 DIAGNOSIS — R0902 Hypoxemia: Secondary | ICD-10-CM | POA: Diagnosis not present

## 2020-03-14 DIAGNOSIS — N281 Cyst of kidney, acquired: Secondary | ICD-10-CM | POA: Diagnosis not present

## 2020-03-14 LAB — LIPASE, BLOOD: Lipase: 27 U/L (ref 11–51)

## 2020-03-14 LAB — CBC WITH DIFFERENTIAL/PLATELET
Abs Immature Granulocytes: 0.03 10*3/uL (ref 0.00–0.07)
Basophils Absolute: 0 10*3/uL (ref 0.0–0.1)
Basophils Relative: 0 %
Eosinophils Absolute: 0.1 10*3/uL (ref 0.0–0.5)
Eosinophils Relative: 1 %
HCT: 38.5 % — ABNORMAL LOW (ref 39.0–52.0)
Hemoglobin: 13.4 g/dL (ref 13.0–17.0)
Immature Granulocytes: 0 %
Lymphocytes Relative: 11 %
Lymphs Abs: 0.8 10*3/uL (ref 0.7–4.0)
MCH: 32 pg (ref 26.0–34.0)
MCHC: 34.8 g/dL (ref 30.0–36.0)
MCV: 91.9 fL (ref 80.0–100.0)
Monocytes Absolute: 0.6 10*3/uL (ref 0.1–1.0)
Monocytes Relative: 8 %
Neutro Abs: 6.1 10*3/uL (ref 1.7–7.7)
Neutrophils Relative %: 80 %
Platelets: 187 10*3/uL (ref 150–400)
RBC: 4.19 MIL/uL — ABNORMAL LOW (ref 4.22–5.81)
RDW: 12.7 % (ref 11.5–15.5)
WBC: 7.7 10*3/uL (ref 4.0–10.5)
nRBC: 0 % (ref 0.0–0.2)

## 2020-03-14 LAB — COMPREHENSIVE METABOLIC PANEL
ALT: 25 U/L (ref 0–44)
AST: 28 U/L (ref 15–41)
Albumin: 4.6 g/dL (ref 3.5–5.0)
Alkaline Phosphatase: 68 U/L (ref 38–126)
Anion gap: 11 (ref 5–15)
BUN: 16 mg/dL (ref 8–23)
CO2: 23 mmol/L (ref 22–32)
Calcium: 9.8 mg/dL (ref 8.9–10.3)
Chloride: 103 mmol/L (ref 98–111)
Creatinine, Ser: 1.06 mg/dL (ref 0.61–1.24)
GFR, Estimated: >60 mL/min (ref >60)
Glucose, Bld: 99 mg/dL (ref 70–99)
Potassium: 3.9 mmol/L (ref 3.5–5.1)
Sodium: 137 mmol/L (ref 135–145)
Total Bilirubin: 0.9 mg/dL (ref 0.3–1.2)
Total Protein: 7 g/dL (ref 6.5–8.1)

## 2020-03-14 LAB — URINALYSIS, ROUTINE W REFLEX MICROSCOPIC
Bilirubin Urine: NEGATIVE
Glucose, UA: NEGATIVE mg/dL
Hgb urine dipstick: NEGATIVE
Ketones, ur: NEGATIVE mg/dL
Nitrite: NEGATIVE
Protein, ur: NEGATIVE mg/dL
Specific Gravity, Urine: 1.032 — ABNORMAL HIGH (ref 1.005–1.030)
pH: 9 — ABNORMAL HIGH (ref 5.0–8.0)

## 2020-03-14 LAB — TROPONIN I (HIGH SENSITIVITY): Troponin I (High Sensitivity): 6 ng/L (ref <18)

## 2020-03-14 MED ORDER — SODIUM CHLORIDE (PF) 0.9 % IJ SOLN
INTRAMUSCULAR | Status: AC
  Filled 2020-03-14: qty 50

## 2020-03-14 MED ORDER — SODIUM CHLORIDE 0.9 % IV BOLUS
500.0000 mL | Freq: Once | INTRAVENOUS | Status: AC
  Administered 2020-03-14: 500 mL via INTRAVENOUS

## 2020-03-14 MED ORDER — IOHEXOL 300 MG/ML  SOLN
100.0000 mL | Freq: Once | INTRAMUSCULAR | Status: AC | PRN
  Administered 2020-03-14: 100 mL via INTRAVENOUS
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
## 2020-03-14 NOTE — ED Provider Notes (Signed)
Patient care assumed at 1500.    Pt with hx/o dementia, had a fall yesterday.  Today had an episode of sob and abdominal pain.  Has a inguinal hernia, no clinical evidence of incarceration.  CT head, abd/pelvis, and UA pending.    CT head and CT abdomen pelvis without acute abnormality. UA is not consistent with UTI, will send culture and treat if positive. On assessment patient is resting comfortably, no current complaints of pain. Discussed with patient and wife findings of studies. Plan to discharge home with outpatient follow-up and return precautions.      Quintella Reichert, MD 03/14/20 [phone]

## 2020-03-14 NOTE — ED Triage Notes (Signed)
Pt arrived via EMS, from home, c/o right sided groin pain. Has hx of inguinal hernia. Hx of dementia.

## 2020-03-16 LAB — URINE CULTURE: Culture: <10000 — AB

## 2020-04-21 DIAGNOSIS — R413 Other amnesia: Secondary | ICD-10-CM | POA: Diagnosis not present

## 2020-04-21 DIAGNOSIS — Z111 Encounter for screening for respiratory tuberculosis: Secondary | ICD-10-CM | POA: Diagnosis not present

## 2020-04-21 DIAGNOSIS — F079 Unspecified personality and behavioral disorder due to known physiological condition: Secondary | ICD-10-CM | POA: Diagnosis not present

## 2020-04-21 DIAGNOSIS — S065X9D Traumatic subdural hemorrhage with loss of consciousness of unspecified duration, subsequent encounter: Secondary | ICD-10-CM | POA: Diagnosis not present

## 2020-04-21 DIAGNOSIS — R6889 Other general symptoms and signs: Secondary | ICD-10-CM | POA: Diagnosis not present

## 2020-05-08 DIAGNOSIS — Z111 Encounter for screening for respiratory tuberculosis: Secondary | ICD-10-CM | POA: Diagnosis not present

## 2020-05-12 DIAGNOSIS — E790 Hyperuricemia without signs of inflammatory arthritis and tophaceous disease: Secondary | ICD-10-CM | POA: Diagnosis not present

## 2020-05-12 DIAGNOSIS — I10 Essential (primary) hypertension: Secondary | ICD-10-CM | POA: Diagnosis not present

## 2020-05-12 DIAGNOSIS — E785 Hyperlipidemia, unspecified: Secondary | ICD-10-CM | POA: Diagnosis not present

## 2020-05-12 DIAGNOSIS — F0391 Unspecified dementia with behavioral disturbance: Secondary | ICD-10-CM | POA: Diagnosis not present

## 2020-05-20 DIAGNOSIS — R300 Dysuria: Secondary | ICD-10-CM | POA: Diagnosis not present

## 2020-05-20 DIAGNOSIS — N3001 Acute cystitis with hematuria: Secondary | ICD-10-CM | POA: Diagnosis not present

## 2020-05-25 DIAGNOSIS — F0281 Dementia in other diseases classified elsewhere with behavioral disturbance: Secondary | ICD-10-CM | POA: Diagnosis not present

## 2020-05-25 DIAGNOSIS — Z9183 Wandering in diseases classified elsewhere: Secondary | ICD-10-CM | POA: Diagnosis not present

## 2020-05-25 DIAGNOSIS — R4689 Other symptoms and signs involving appearance and behavior: Secondary | ICD-10-CM | POA: Diagnosis not present

## 2020-05-25 DIAGNOSIS — Z9189 Other specified personal risk factors, not elsewhere classified: Secondary | ICD-10-CM | POA: Diagnosis not present

## 2020-06-02 DIAGNOSIS — F0281 Dementia in other diseases classified elsewhere with behavioral disturbance: Secondary | ICD-10-CM | POA: Diagnosis not present

## 2020-06-02 DIAGNOSIS — Z9189 Other specified personal risk factors, not elsewhere classified: Secondary | ICD-10-CM | POA: Diagnosis not present

## 2020-06-02 DIAGNOSIS — W19XXXA Unspecified fall, initial encounter: Secondary | ICD-10-CM | POA: Diagnosis not present

## 2020-06-02 DIAGNOSIS — Z9183 Wandering in diseases classified elsewhere: Secondary | ICD-10-CM | POA: Diagnosis not present

## 2020-06-02 DIAGNOSIS — R4689 Other symptoms and signs involving appearance and behavior: Secondary | ICD-10-CM | POA: Diagnosis not present

## 2020-06-02 DIAGNOSIS — E785 Hyperlipidemia, unspecified: Secondary | ICD-10-CM | POA: Diagnosis not present

## 2020-06-02 DIAGNOSIS — F0391 Unspecified dementia with behavioral disturbance: Secondary | ICD-10-CM | POA: Diagnosis not present

## 2020-06-02 DIAGNOSIS — F079 Unspecified personality and behavioral disorder due to known physiological condition: Secondary | ICD-10-CM | POA: Diagnosis not present

## 2020-06-08 DIAGNOSIS — R4182 Altered mental status, unspecified: Secondary | ICD-10-CM | POA: Diagnosis not present

## 2020-06-08 DIAGNOSIS — G309 Alzheimer's disease, unspecified: Secondary | ICD-10-CM | POA: Diagnosis not present

## 2020-06-08 DIAGNOSIS — Z79899 Other long term (current) drug therapy: Secondary | ICD-10-CM | POA: Diagnosis not present

## 2020-06-08 DIAGNOSIS — S022XXA Fracture of nasal bones, initial encounter for closed fracture: Secondary | ICD-10-CM | POA: Diagnosis not present

## 2020-06-08 DIAGNOSIS — T1490XA Injury, unspecified, initial encounter: Secondary | ICD-10-CM | POA: Diagnosis not present

## 2020-06-08 DIAGNOSIS — S0081XA Abrasion of other part of head, initial encounter: Secondary | ICD-10-CM | POA: Diagnosis not present

## 2020-06-08 DIAGNOSIS — S01511A Laceration without foreign body of lip, initial encounter: Secondary | ICD-10-CM | POA: Diagnosis not present

## 2020-06-08 DIAGNOSIS — F418 Other specified anxiety disorders: Secondary | ICD-10-CM | POA: Diagnosis not present

## 2020-06-08 DIAGNOSIS — R239 Unspecified skin changes: Secondary | ICD-10-CM | POA: Diagnosis not present

## 2020-06-08 DIAGNOSIS — S199XXA Unspecified injury of neck, initial encounter: Secondary | ICD-10-CM | POA: Diagnosis not present

## 2020-06-08 DIAGNOSIS — F0280 Dementia in other diseases classified elsewhere without behavioral disturbance: Secondary | ICD-10-CM | POA: Diagnosis not present

## 2020-06-08 DIAGNOSIS — M25561 Pain in right knee: Secondary | ICD-10-CM | POA: Diagnosis not present

## 2020-06-08 DIAGNOSIS — M25551 Pain in right hip: Secondary | ICD-10-CM | POA: Diagnosis not present

## 2020-06-08 DIAGNOSIS — F0390 Unspecified dementia without behavioral disturbance: Secondary | ICD-10-CM | POA: Diagnosis not present

## 2020-06-09 DIAGNOSIS — W19XXXA Unspecified fall, initial encounter: Secondary | ICD-10-CM | POA: Diagnosis not present

## 2020-06-09 DIAGNOSIS — S022XXA Fracture of nasal bones, initial encounter for closed fracture: Secondary | ICD-10-CM | POA: Diagnosis not present

## 2020-06-09 DIAGNOSIS — R269 Unspecified abnormalities of gait and mobility: Secondary | ICD-10-CM | POA: Diagnosis not present

## 2020-06-09 DIAGNOSIS — F419 Anxiety disorder, unspecified: Secondary | ICD-10-CM | POA: Diagnosis not present

## 2020-06-09 DIAGNOSIS — R2681 Unsteadiness on feet: Secondary | ICD-10-CM | POA: Diagnosis not present

## 2020-06-09 DIAGNOSIS — S0990XA Unspecified injury of head, initial encounter: Secondary | ICD-10-CM | POA: Diagnosis not present

## 2020-06-09 DIAGNOSIS — Z9181 History of falling: Secondary | ICD-10-CM | POA: Diagnosis not present

## 2020-06-09 DIAGNOSIS — R4182 Altered mental status, unspecified: Secondary | ICD-10-CM | POA: Diagnosis not present

## 2020-06-09 DIAGNOSIS — S0121XA Laceration without foreign body of nose, initial encounter: Secondary | ICD-10-CM | POA: Diagnosis not present

## 2020-06-09 DIAGNOSIS — F0391 Unspecified dementia with behavioral disturbance: Secondary | ICD-10-CM | POA: Diagnosis not present

## 2020-06-09 DIAGNOSIS — G9389 Other specified disorders of brain: Secondary | ICD-10-CM | POA: Diagnosis not present

## 2020-06-09 DIAGNOSIS — S8001XA Contusion of right knee, initial encounter: Secondary | ICD-10-CM | POA: Diagnosis not present

## 2020-06-09 DIAGNOSIS — N390 Urinary tract infection, site not specified: Secondary | ICD-10-CM | POA: Diagnosis not present

## 2020-06-09 DIAGNOSIS — S199XXA Unspecified injury of neck, initial encounter: Secondary | ICD-10-CM | POA: Diagnosis not present

## 2020-06-09 DIAGNOSIS — Z043 Encounter for examination and observation following other accident: Secondary | ICD-10-CM | POA: Diagnosis not present

## 2020-06-09 DIAGNOSIS — T1490XA Injury, unspecified, initial encounter: Secondary | ICD-10-CM | POA: Diagnosis not present

## 2020-06-09 DIAGNOSIS — S79911A Unspecified injury of right hip, initial encounter: Secondary | ICD-10-CM | POA: Diagnosis not present

## 2020-06-09 DIAGNOSIS — G501 Atypical facial pain: Secondary | ICD-10-CM | POA: Diagnosis not present

## 2020-06-09 DIAGNOSIS — I959 Hypotension, unspecified: Secondary | ICD-10-CM | POA: Diagnosis not present

## 2020-06-10 DIAGNOSIS — S79911A Unspecified injury of right hip, initial encounter: Secondary | ICD-10-CM | POA: Diagnosis not present

## 2020-06-10 DIAGNOSIS — G4700 Insomnia, unspecified: Secondary | ICD-10-CM | POA: Diagnosis not present

## 2020-06-10 DIAGNOSIS — S0121XA Laceration without foreign body of nose, initial encounter: Secondary | ICD-10-CM | POA: Diagnosis not present

## 2020-06-10 DIAGNOSIS — S022XXA Fracture of nasal bones, initial encounter for closed fracture: Secondary | ICD-10-CM | POA: Diagnosis not present

## 2020-06-10 DIAGNOSIS — R040 Epistaxis: Secondary | ICD-10-CM | POA: Diagnosis not present

## 2020-06-10 DIAGNOSIS — W19XXXA Unspecified fall, initial encounter: Secondary | ICD-10-CM | POA: Diagnosis not present

## 2020-06-10 DIAGNOSIS — F419 Anxiety disorder, unspecified: Secondary | ICD-10-CM | POA: Diagnosis not present

## 2020-06-10 DIAGNOSIS — M542 Cervicalgia: Secondary | ICD-10-CM | POA: Diagnosis not present

## 2020-06-10 DIAGNOSIS — F0391 Unspecified dementia with behavioral disturbance: Secondary | ICD-10-CM | POA: Diagnosis not present

## 2020-06-10 DIAGNOSIS — Z043 Encounter for examination and observation following other accident: Secondary | ICD-10-CM | POA: Diagnosis not present

## 2020-06-10 DIAGNOSIS — Z8679 Personal history of other diseases of the circulatory system: Secondary | ICD-10-CM | POA: Diagnosis not present

## 2020-06-10 DIAGNOSIS — S8001XA Contusion of right knee, initial encounter: Secondary | ICD-10-CM | POA: Diagnosis not present

## 2020-06-10 DIAGNOSIS — S0990XA Unspecified injury of head, initial encounter: Secondary | ICD-10-CM | POA: Diagnosis not present

## 2020-06-10 DIAGNOSIS — S199XXA Unspecified injury of neck, initial encounter: Secondary | ICD-10-CM | POA: Diagnosis not present

## 2020-06-10 DIAGNOSIS — T1490XA Injury, unspecified, initial encounter: Secondary | ICD-10-CM | POA: Diagnosis not present

## 2020-06-11 DIAGNOSIS — R451 Restlessness and agitation: Secondary | ICD-10-CM | POA: Diagnosis not present

## 2020-06-11 DIAGNOSIS — S022XXA Fracture of nasal bones, initial encounter for closed fracture: Secondary | ICD-10-CM | POA: Diagnosis not present

## 2020-06-11 DIAGNOSIS — W19XXXA Unspecified fall, initial encounter: Secondary | ICD-10-CM | POA: Diagnosis not present

## 2020-06-11 DIAGNOSIS — F0391 Unspecified dementia with behavioral disturbance: Secondary | ICD-10-CM | POA: Diagnosis not present

## 2020-06-11 DIAGNOSIS — Z8679 Personal history of other diseases of the circulatory system: Secondary | ICD-10-CM | POA: Diagnosis not present

## 2020-06-12 DIAGNOSIS — Z8679 Personal history of other diseases of the circulatory system: Secondary | ICD-10-CM | POA: Diagnosis not present

## 2020-06-12 DIAGNOSIS — W19XXXA Unspecified fall, initial encounter: Secondary | ICD-10-CM | POA: Diagnosis not present

## 2020-06-12 DIAGNOSIS — F0391 Unspecified dementia with behavioral disturbance: Secondary | ICD-10-CM | POA: Diagnosis not present

## 2020-06-12 DIAGNOSIS — S022XXA Fracture of nasal bones, initial encounter for closed fracture: Secondary | ICD-10-CM | POA: Diagnosis not present

## 2020-06-13 DIAGNOSIS — F0391 Unspecified dementia with behavioral disturbance: Secondary | ICD-10-CM | POA: Diagnosis not present

## 2020-06-13 DIAGNOSIS — W19XXXA Unspecified fall, initial encounter: Secondary | ICD-10-CM | POA: Diagnosis not present

## 2020-06-13 DIAGNOSIS — S022XXA Fracture of nasal bones, initial encounter for closed fracture: Secondary | ICD-10-CM | POA: Diagnosis not present

## 2020-06-13 DIAGNOSIS — N179 Acute kidney failure, unspecified: Secondary | ICD-10-CM | POA: Diagnosis not present

## 2020-06-14 DIAGNOSIS — F0391 Unspecified dementia with behavioral disturbance: Secondary | ICD-10-CM | POA: Diagnosis not present

## 2020-06-14 DIAGNOSIS — M1711 Unilateral primary osteoarthritis, right knee: Secondary | ICD-10-CM | POA: Diagnosis not present

## 2020-06-14 DIAGNOSIS — N179 Acute kidney failure, unspecified: Secondary | ICD-10-CM | POA: Diagnosis not present

## 2020-06-14 DIAGNOSIS — S022XXA Fracture of nasal bones, initial encounter for closed fracture: Secondary | ICD-10-CM | POA: Diagnosis not present

## 2020-06-14 DIAGNOSIS — W19XXXA Unspecified fall, initial encounter: Secondary | ICD-10-CM | POA: Diagnosis not present

## 2020-06-15 DIAGNOSIS — F0391 Unspecified dementia with behavioral disturbance: Secondary | ICD-10-CM | POA: Diagnosis not present

## 2020-06-15 DIAGNOSIS — S022XXA Fracture of nasal bones, initial encounter for closed fracture: Secondary | ICD-10-CM | POA: Diagnosis not present

## 2020-06-15 DIAGNOSIS — W19XXXA Unspecified fall, initial encounter: Secondary | ICD-10-CM | POA: Diagnosis not present

## 2020-06-15 DIAGNOSIS — Z8679 Personal history of other diseases of the circulatory system: Secondary | ICD-10-CM | POA: Diagnosis not present

## 2020-06-16 DIAGNOSIS — Z8679 Personal history of other diseases of the circulatory system: Secondary | ICD-10-CM | POA: Diagnosis not present

## 2020-06-16 DIAGNOSIS — F0391 Unspecified dementia with behavioral disturbance: Secondary | ICD-10-CM | POA: Diagnosis not present

## 2020-06-16 DIAGNOSIS — W19XXXA Unspecified fall, initial encounter: Secondary | ICD-10-CM | POA: Diagnosis not present

## 2020-06-16 DIAGNOSIS — S022XXA Fracture of nasal bones, initial encounter for closed fracture: Secondary | ICD-10-CM | POA: Diagnosis not present

## 2020-06-17 DIAGNOSIS — F0391 Unspecified dementia with behavioral disturbance: Secondary | ICD-10-CM | POA: Diagnosis not present

## 2020-06-17 DIAGNOSIS — W19XXXA Unspecified fall, initial encounter: Secondary | ICD-10-CM | POA: Diagnosis not present

## 2020-06-17 DIAGNOSIS — Z8679 Personal history of other diseases of the circulatory system: Secondary | ICD-10-CM | POA: Diagnosis not present

## 2020-06-17 DIAGNOSIS — S022XXA Fracture of nasal bones, initial encounter for closed fracture: Secondary | ICD-10-CM | POA: Diagnosis not present

## 2020-06-18 DIAGNOSIS — Z8679 Personal history of other diseases of the circulatory system: Secondary | ICD-10-CM | POA: Diagnosis not present

## 2020-06-18 DIAGNOSIS — W19XXXA Unspecified fall, initial encounter: Secondary | ICD-10-CM | POA: Diagnosis not present

## 2020-06-18 DIAGNOSIS — S022XXA Fracture of nasal bones, initial encounter for closed fracture: Secondary | ICD-10-CM | POA: Diagnosis not present

## 2020-06-18 DIAGNOSIS — F0391 Unspecified dementia with behavioral disturbance: Secondary | ICD-10-CM | POA: Diagnosis not present

## 2020-06-19 DIAGNOSIS — W19XXXA Unspecified fall, initial encounter: Secondary | ICD-10-CM | POA: Diagnosis not present

## 2020-06-19 DIAGNOSIS — F0391 Unspecified dementia with behavioral disturbance: Secondary | ICD-10-CM | POA: Diagnosis not present

## 2020-06-19 DIAGNOSIS — Z8679 Personal history of other diseases of the circulatory system: Secondary | ICD-10-CM | POA: Diagnosis not present

## 2020-06-19 DIAGNOSIS — S022XXA Fracture of nasal bones, initial encounter for closed fracture: Secondary | ICD-10-CM | POA: Diagnosis not present

## 2020-06-20 DIAGNOSIS — E785 Hyperlipidemia, unspecified: Secondary | ICD-10-CM | POA: Diagnosis not present

## 2020-06-20 DIAGNOSIS — I129 Hypertensive chronic kidney disease with stage 1 through stage 4 chronic kidney disease, or unspecified chronic kidney disease: Secondary | ICD-10-CM | POA: Diagnosis not present

## 2020-06-20 DIAGNOSIS — S01511A Laceration without foreign body of lip, initial encounter: Secondary | ICD-10-CM | POA: Diagnosis not present

## 2020-06-20 DIAGNOSIS — N3289 Other specified disorders of bladder: Secondary | ICD-10-CM | POA: Diagnosis not present

## 2020-06-20 DIAGNOSIS — N35912 Unspecified bulbous urethral stricture, male: Secondary | ICD-10-CM | POA: Diagnosis not present

## 2020-06-20 DIAGNOSIS — Z20822 Contact with and (suspected) exposure to covid-19: Secondary | ICD-10-CM | POA: Diagnosis not present

## 2020-06-20 DIAGNOSIS — F05 Delirium due to known physiological condition: Secondary | ICD-10-CM | POA: Diagnosis not present

## 2020-06-20 DIAGNOSIS — S8001XA Contusion of right knee, initial encounter: Secondary | ICD-10-CM | POA: Diagnosis not present

## 2020-06-20 DIAGNOSIS — R296 Repeated falls: Secondary | ICD-10-CM | POA: Diagnosis not present

## 2020-06-20 DIAGNOSIS — F0391 Unspecified dementia with behavioral disturbance: Secondary | ICD-10-CM | POA: Diagnosis not present

## 2020-06-20 DIAGNOSIS — Z7982 Long term (current) use of aspirin: Secondary | ICD-10-CM | POA: Diagnosis not present

## 2020-06-20 DIAGNOSIS — C689 Malignant neoplasm of urinary organ, unspecified: Secondary | ICD-10-CM | POA: Diagnosis not present

## 2020-06-20 DIAGNOSIS — W19XXXA Unspecified fall, initial encounter: Secondary | ICD-10-CM | POA: Diagnosis not present

## 2020-06-20 DIAGNOSIS — Z8551 Personal history of malignant neoplasm of bladder: Secondary | ICD-10-CM | POA: Diagnosis not present

## 2020-06-20 DIAGNOSIS — G4700 Insomnia, unspecified: Secondary | ICD-10-CM | POA: Diagnosis not present

## 2020-06-20 DIAGNOSIS — N179 Acute kidney failure, unspecified: Secondary | ICD-10-CM | POA: Diagnosis not present

## 2020-06-20 DIAGNOSIS — Z515 Encounter for palliative care: Secondary | ICD-10-CM | POA: Diagnosis not present

## 2020-06-20 DIAGNOSIS — N182 Chronic kidney disease, stage 2 (mild): Secondary | ICD-10-CM | POA: Diagnosis not present

## 2020-06-20 DIAGNOSIS — S022XXA Fracture of nasal bones, initial encounter for closed fracture: Secondary | ICD-10-CM | POA: Diagnosis not present

## 2020-06-20 DIAGNOSIS — N400 Enlarged prostate without lower urinary tract symptoms: Secondary | ICD-10-CM | POA: Diagnosis not present

## 2020-06-20 DIAGNOSIS — Z7189 Other specified counseling: Secondary | ICD-10-CM | POA: Diagnosis not present

## 2020-06-20 DIAGNOSIS — Z8679 Personal history of other diseases of the circulatory system: Secondary | ICD-10-CM | POA: Diagnosis not present

## 2020-06-20 DIAGNOSIS — R001 Bradycardia, unspecified: Secondary | ICD-10-CM | POA: Diagnosis not present

## 2020-06-20 DIAGNOSIS — D494 Neoplasm of unspecified behavior of bladder: Secondary | ICD-10-CM | POA: Diagnosis not present

## 2020-06-20 DIAGNOSIS — C679 Malignant neoplasm of bladder, unspecified: Secondary | ICD-10-CM | POA: Diagnosis not present

## 2020-06-20 DIAGNOSIS — S0121XA Laceration without foreign body of nose, initial encounter: Secondary | ICD-10-CM | POA: Diagnosis not present

## 2020-06-20 DIAGNOSIS — Z66 Do not resuscitate: Secondary | ICD-10-CM | POA: Diagnosis not present

## 2020-06-20 DIAGNOSIS — E222 Syndrome of inappropriate secretion of antidiuretic hormone: Secondary | ICD-10-CM | POA: Diagnosis not present

## 2020-09-13 DEATH — deceased

## 2022-10-04 IMAGING — CT CT HEAD W/O CM
3 of 7 series · 12 of 47 positions shown, 14 images · non-contrast
Comparison: January 19, 2020.

CLINICAL DATA: Altered mental status.

EXAM:
CT HEAD WITHOUT CONTRAST
TECHNIQUE: Contiguous axial images were obtained from the base of the skull
through the vertex without intravenous contrast.

[Series 2: head wo · axial · 0.47mm/px · z∈[-128,-13]mm · 7 of 29 slices shown, 9 images]
[im 3/29  brain]
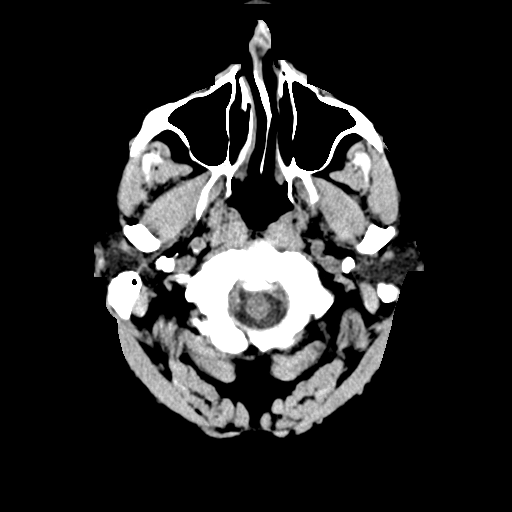
[im 3/29  bone]
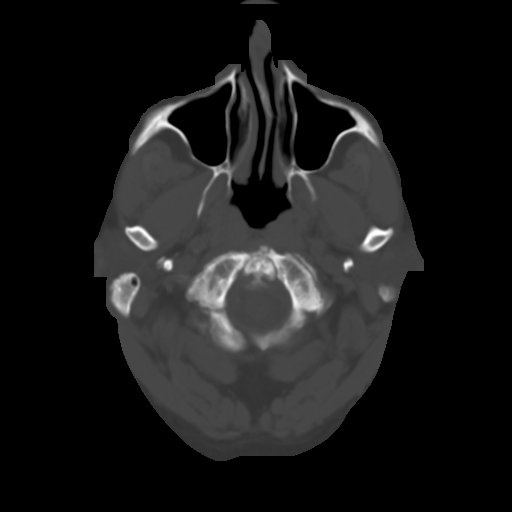
[im 6/29  brain]
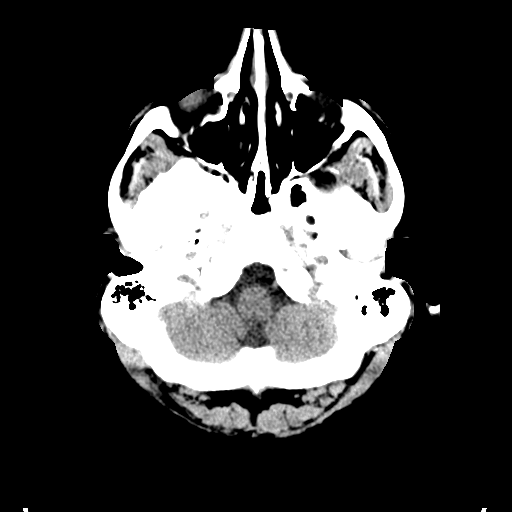
[im 11/29  brain]
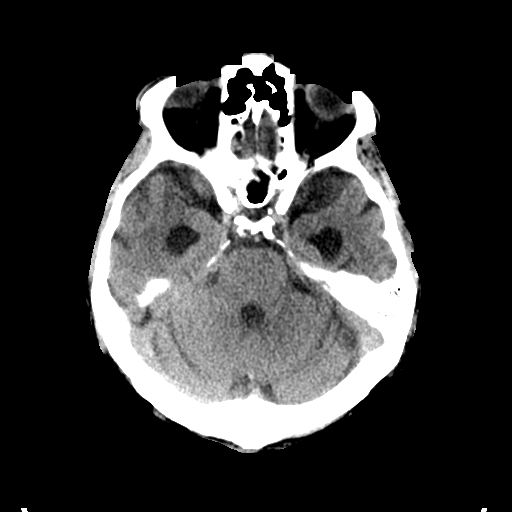
[im 15/29  brain]
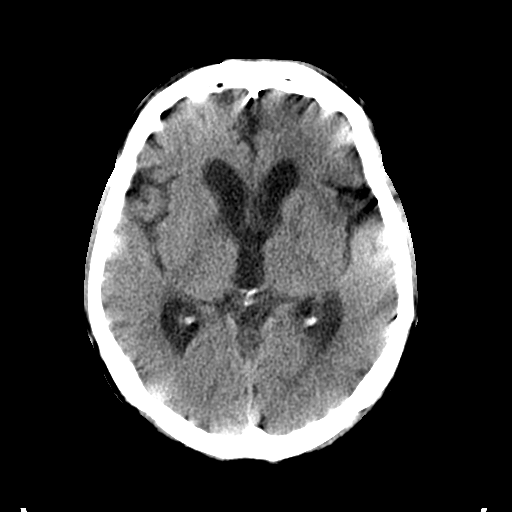
[im 18/29  brain]
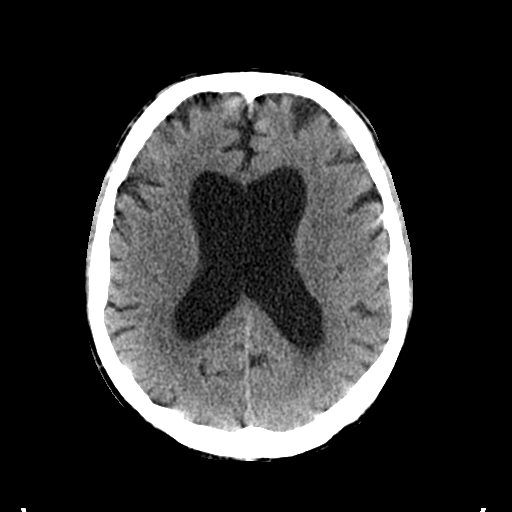
[im 18/29  bone]
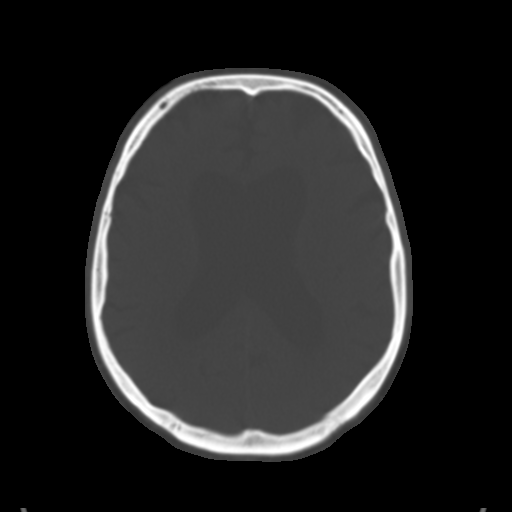
[im 23/29  brain]
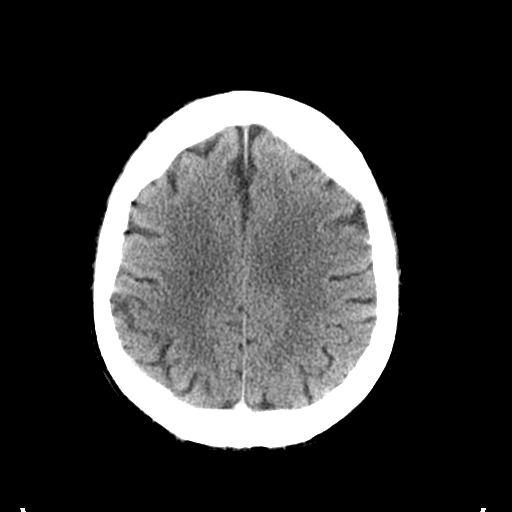
[im 26/29  brain]
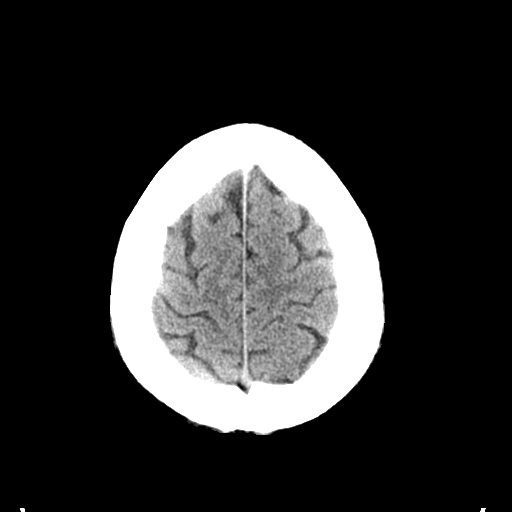

[Series 9: coronal soft tissue · coronal · 0.30mm/px · 3 of 83 slices shown]
[im 21/83  brain]
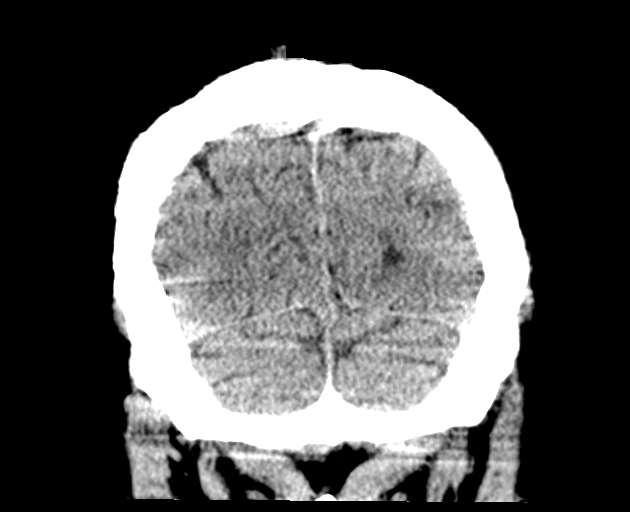
[im 42/83  brain]
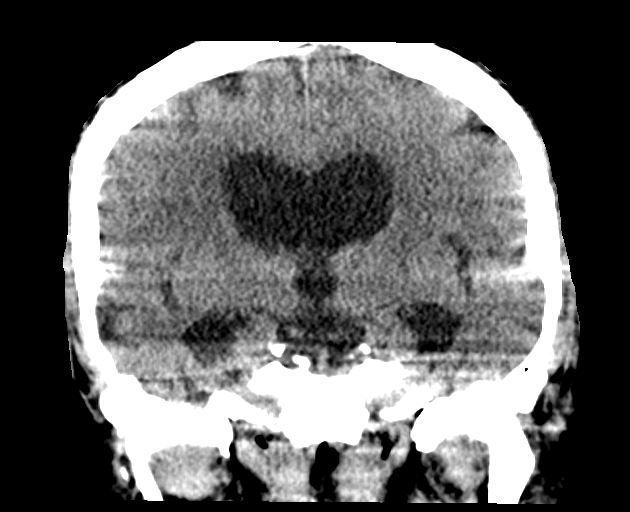
[im 62/83  brain]
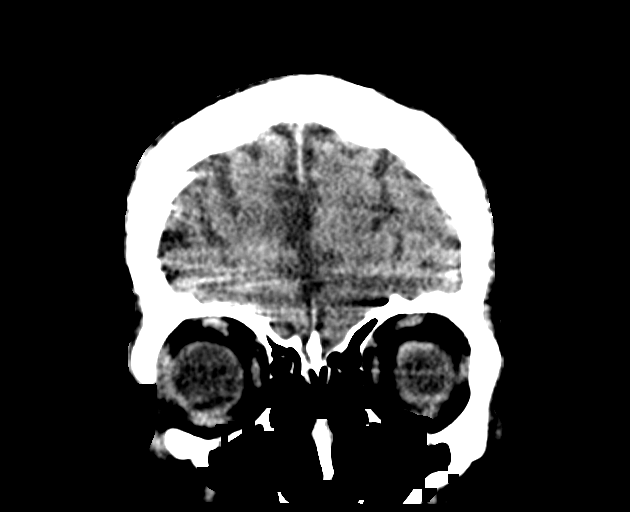

[Series 13: sagittal soft tissue · sagittal · 0.14mm/px · 2 of 64 slices shown]
[im 22/64  brain]
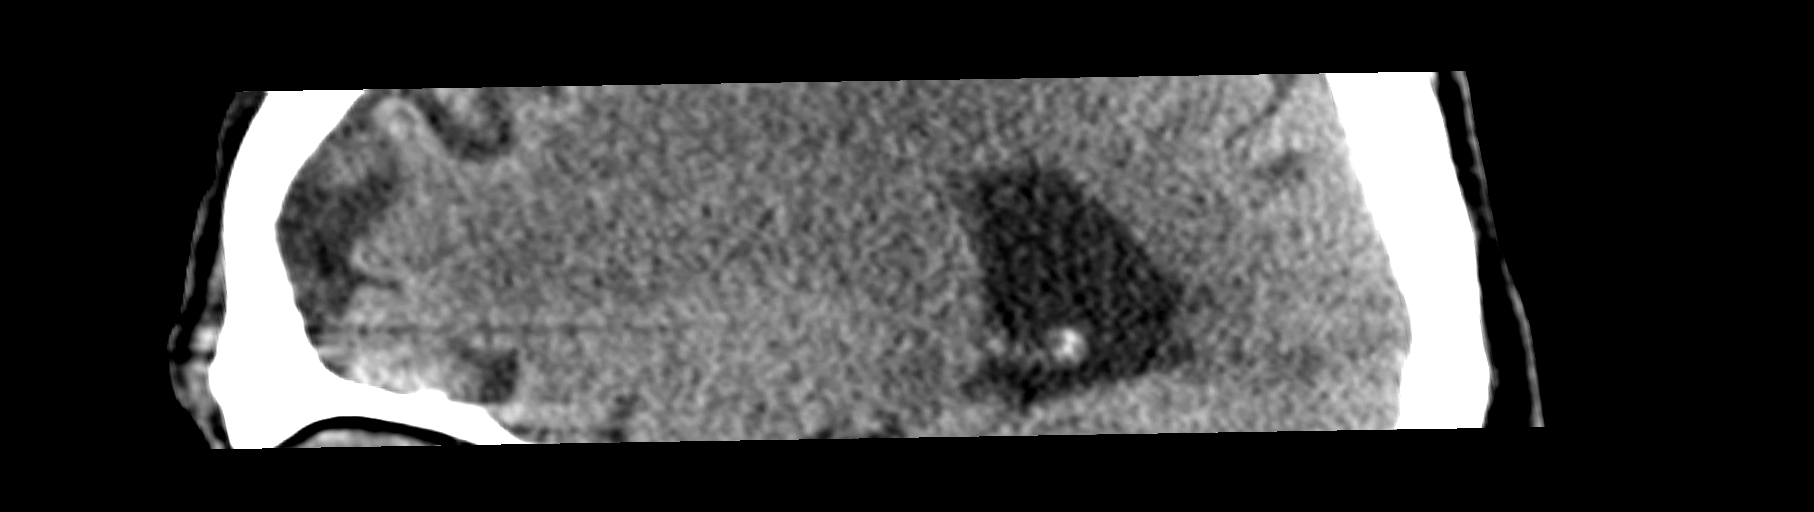
[im 43/64  brain]
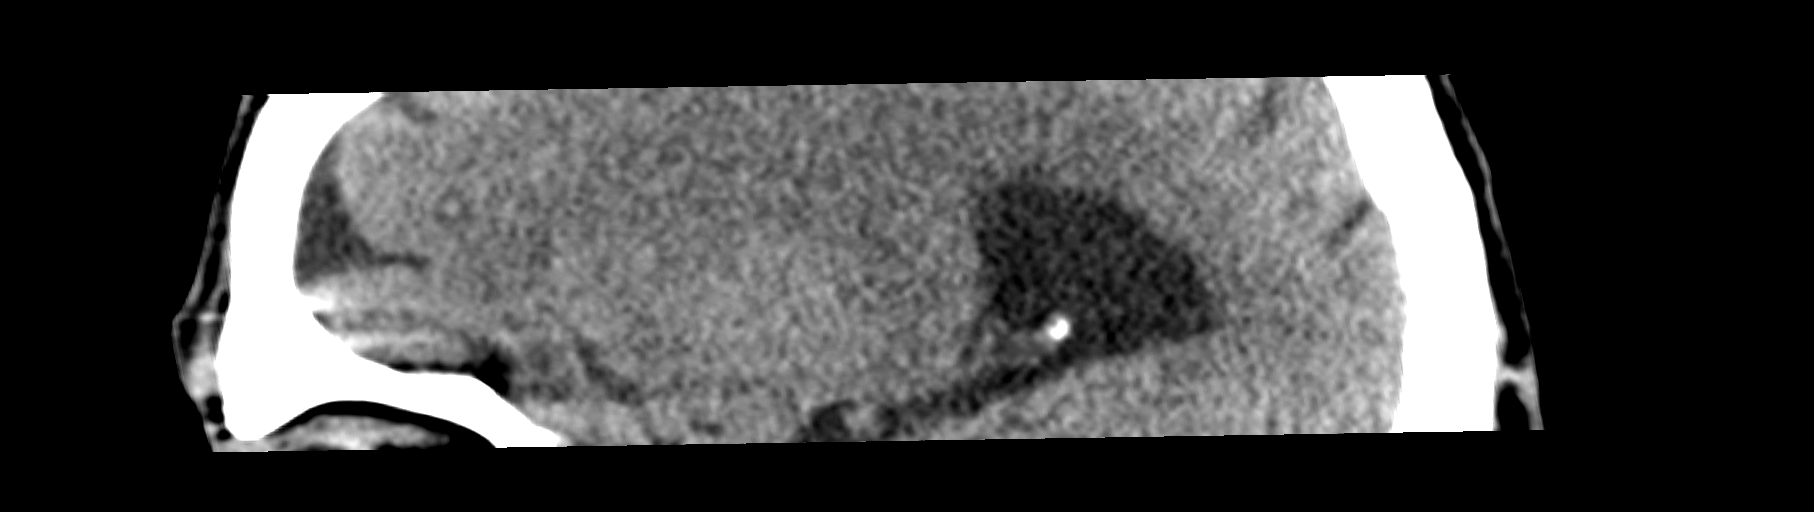

[12 of 47 positions shown; findings below may reference images not displayed]

FINDINGS: Brain: Mild diffuse cortical atrophy is noted. No mass effect or
midline shift is noted. Ventricular size is within normal limits.
There is no evidence of mass lesion, hemorrhage or acute infarction.

Vascular: No hyperdense vessel or unexpected calcification.

Skull: Normal. Negative for fracture or focal lesion.

Sinuses/Orbits: No acute finding.

Other: None.
IMPRESSION: Mild diffuse cortical atrophy. No acute intracranial abnormality
seen.
